# Patient Record
Sex: Female | Born: 1990 | Race: Black or African American | Hispanic: No | Marital: Single | Smoking: Former smoker
Health system: Southern US, Community
[De-identification: ages and names within clinical notes are randomized; demographics above are authoritative.]

## PROBLEM LIST (undated history)

## (undated) ENCOUNTER — Inpatient Hospital Stay (HOSPITAL_COMMUNITY): Payer: Self-pay

## (undated) DIAGNOSIS — Z789 Other specified health status: Secondary | ICD-10-CM

## (undated) DIAGNOSIS — F419 Anxiety disorder, unspecified: Secondary | ICD-10-CM

## (undated) DIAGNOSIS — F32A Depression, unspecified: Secondary | ICD-10-CM

---

## 2019-02-11 ENCOUNTER — Other Ambulatory Visit: Payer: Self-pay

## 2019-02-11 ENCOUNTER — Encounter (HOSPITAL_COMMUNITY): Payer: Self-pay

## 2019-02-11 ENCOUNTER — Emergency Department (HOSPITAL_COMMUNITY)
Admission: EM | Admit: 2019-02-11 | Discharge: 2019-02-12 | Disposition: A | Discharge status: Home / Self Care | Payer: Medicaid Other | Attending: Emergency Medicine | Admitting: Emergency Medicine

## 2019-02-11 DIAGNOSIS — R51 Headache: Secondary | ICD-10-CM | POA: Insufficient documentation

## 2019-02-11 DIAGNOSIS — B349 Viral infection, unspecified: Secondary | ICD-10-CM

## 2019-02-11 DIAGNOSIS — F17210 Nicotine dependence, cigarettes, uncomplicated: Secondary | ICD-10-CM | POA: Insufficient documentation

## 2019-02-11 DIAGNOSIS — R519 Headache, unspecified: Secondary | ICD-10-CM

## 2019-02-11 DIAGNOSIS — M791 Myalgia, unspecified site: Secondary | ICD-10-CM | POA: Diagnosis not present

## 2019-02-11 DIAGNOSIS — R509 Fever, unspecified: Secondary | ICD-10-CM | POA: Diagnosis present

## 2019-02-11 LAB — CBC WITH DIFFERENTIAL/PLATELET
Abs Immature Granulocytes: 0.02 10*3/uL (ref 0.00–0.07)
Basophils Absolute: 0 10*3/uL (ref 0.0–0.1)
Basophils Relative: 1 %
Eosinophils Absolute: 0 10*3/uL (ref 0.0–0.5)
Eosinophils Relative: 0 %
HCT: 40.7 % (ref 36.0–46.0)
Hemoglobin: 13.1 g/dL (ref 12.0–15.0)
Immature Granulocytes: 0 %
Lymphocytes Relative: 23 %
Lymphs Abs: 1.3 10*3/uL (ref 0.7–4.0)
MCH: 29.8 pg (ref 26.0–34.0)
MCHC: 32.2 g/dL (ref 30.0–36.0)
MCV: 92.7 fL (ref 80.0–100.0)
Monocytes Absolute: 1.2 10*3/uL — ABNORMAL HIGH (ref 0.1–1.0)
Monocytes Relative: 21 %
Neutro Abs: 3 10*3/uL (ref 1.7–7.7)
Neutrophils Relative %: 55 %
Platelets: 170 10*3/uL (ref 150–400)
RBC: 4.39 MIL/uL (ref 3.87–5.11)
RDW: 13.1 % (ref 11.5–15.5)
WBC: 5.5 10*3/uL (ref 4.0–10.5)
nRBC: 0 % (ref 0.0–0.2)

## 2019-02-11 LAB — LACTIC ACID, PLASMA: Lactic Acid, Venous: 1.3 mmol/L (ref 0.5–1.9)

## 2019-02-11 LAB — I-STAT BETA HCG BLOOD, ED (MC, WL, AP ONLY): I-stat hCG, quantitative: <5 m[IU]/mL (ref <5)

## 2019-02-11 MED ORDER — METHOCARBAMOL 500 MG PO TABS
1000.0000 mg | ORAL_TABLET | Freq: Once | ORAL | Status: AC
  Administered 2019-02-11: 23:00:00 1000 mg via ORAL
  Filled 2019-02-11: qty 2

## 2019-02-11 MED ORDER — METOCLOPRAMIDE HCL 5 MG/ML IJ SOLN
10.0000 mg | Freq: Once | INTRAMUSCULAR | Status: DC
  Filled 2019-02-11: qty 2

## 2019-02-11 MED ORDER — SODIUM CHLORIDE 0.9 % IV BOLUS
1000.0000 mL | Freq: Once | INTRAVENOUS | Status: AC
  Administered 2019-02-11: 1000 mL via INTRAVENOUS

## 2019-02-11 MED ORDER — KETOROLAC TROMETHAMINE 30 MG/ML IJ SOLN
30.0000 mg | Freq: Once | INTRAMUSCULAR | Status: AC
  Administered 2019-02-11: 30 mg via INTRAVENOUS
  Filled 2019-02-11: qty 1

## 2019-02-11 NOTE — ED Provider Notes (Signed)
[facility]-EMERGENCY DEPT Provider Note   CSN: [id number] Arrival date & time: 02/11/19  1833    History   Chief Complaint Chief Complaint  Patient presents with  . Fever  . Headache  . Generalized Body Aches    HPI Chelsey Townsend is a 28 y.o. female with no pmh presents to ER for evaluation of subjective fevers associated with chills, shivering, generalized, pounding headache, body aches including neck, back and extremity pain, intermittently productive cough, sharp central fleeting chest pain that is positional, light headedness and nausea.  She took theraflu without relief. No modifying factors. She has h/o occasional headaches. She lives with her parents, children and no one is sick at home. She is only going to grocery store otherwise isolating herself. No sick contacts. No travel. No exposure to suspected or confirmed COVID.   No associated rhinorrhea, congestion, sore throat, nausea, vomiting, abdominal pain, diarrhea, dysuria, hematuria.   No head trauma, blood thinners. Pain in the neck with movement but no stiffness. No rash. No photophobia, vision changes.      HPI  History reviewed. No pertinent past medical history.  There are no active problems to display for this patient.   History reviewed. No pertinent surgical history.   OB History   No obstetric history on file.      Home Medications    Prior to Admission medications   Medication Sig Start Date End Date Taking? Authorizing Provider  cyclobenzaprine (FLEXERIL) 10 MG tablet Take 1 tablet (10 mg total) by mouth 3 (three) times daily for 5 days. 02/12/19 02/17/19  Liberty HandyGibbons,  J, PA-C    Family History Family History  Problem Relation Age of Onset  . Hypertension Mother   . Diabetes Mother   . Hypertension Father   . Diabetes Father     Social History Social History   Tobacco Use  . Smoking status: Current Some Day Smoker    Types: Cigarettes  . Smokeless tobacco:  Never Used  Substance Use Topics  . Alcohol use: Never    Frequency: Never  . Drug use: Never     Allergies   Patient has no known allergies.   Review of Systems Review of Systems  Constitutional: Positive for chills and fever.  Respiratory: Positive for cough.   Cardiovascular: Positive for chest pain.  Gastrointestinal: Positive for nausea.  Musculoskeletal: Positive for myalgias and neck pain.  Neurological: Positive for light-headedness and headaches.  All other systems reviewed and are negative.    Physical Exam Updated Vital Signs BP 93/72   Pulse 86   Temp 100.3 F (37.9 C) (Oral)   Resp 14   Ht 5\' 2"  (1.575 m)   Wt 59 kg   LMP 01/11/2019   SpO2 100%   BMI 23.78 kg/m   Physical Exam Vitals signs and nursing note reviewed.  Constitutional:      Appearance: She is well-developed.     Comments: Non toxic in NAD.  Feels warm, temperature rechecked 100.3 orally.  HENT:     Head: Normocephalic and atraumatic.     Nose: Nose normal.  Eyes:     Conjunctiva/sclera: Conjunctivae normal.  Neck:     Musculoskeletal: Normal range of motion. Muscular tenderness present.     Comments: Muscular tenderness to the paraspinal cervical muscles, bilateral trapezius, SCM's.  No midline tenderness or step-offs.  She has full range of motion of the neck with some pain noted.  No true meningismus.  Negative Brudzinski's and Kernig's.  Cardiovascular:     Rate and Rhythm: Normal rate and regular rhythm.  Pulmonary:     Effort: Pulmonary effort is normal.     Breath sounds: Normal breath sounds.  Abdominal:     General: Bowel sounds are normal.     Palpations: Abdomen is soft.     Comments: No G/R/R. No suprapubic or CVA tenderness. Negative Murphy's and McBurney's. Active BS to lower quadrants.   Musculoskeletal: Normal range of motion.     Comments: Mild thoracic and lumbar muscular tenderness.  No midline T L-spine tenderness.  Mild upper and lower extremity muscular  tenderness.  Compartments are soft.  Skin:    General: Skin is warm and dry.     Capillary Refill: Capillary refill takes less than 2 seconds.  Neurological:     Mental Status: She is alert.     Comments:  Alert and oriented to self, place, time and event.  Speech is fluent without dysarthria or dysphasia. Strength 5/5 in upper and lower extremities..   Sensation to light touch intact in face, upper and lower extremities. Normal gai No pronator drift. No leg drop. Normal finger-to-nose.  CN I not tested CN II grossly intact visual fields bilaterally. Unable to visualize posterior eye. CN III, IV, VI PEERL and EOMs intact bilaterally CN V light touch intact in all 3 divisions of trigeminal nerve CN VII facial movements symmetric CN VIII not tested CN IX, X no uvula deviation, symmetric rise of soft palate  CN XI 5/5 SCM and trapezius strength bilaterally  CN XII Midline tongue protrusion, symmetric L/R movements  Psychiatric:        Behavior: Behavior normal.      ED Treatments / Results  Labs (all labs ordered are listed, but only abnormal results are displayed) Labs Reviewed  CBC WITH DIFFERENTIAL/PLATELET - Abnormal; Notable for the following components:      Result Value   Monocytes Absolute 1.2 (*)    All other components within normal limits  BASIC METABOLIC PANEL - Abnormal; Notable for the following components:   CO2 21 (*)    All other components within normal limits  LACTIC ACID, PLASMA  LACTIC ACID, PLASMA  I-STAT BETA HCG BLOOD, ED ([facility], [facility], [facility] ONLY)    EKG None  Radiology No results found.  Procedures Procedures (including critical care time)  Medications Ordered in ED Medications  sodium chloride 0.9 % bolus 1,000 mL (0 mLs Intravenous Stopped 02/12/19 0037)  ketorolac (TORADOL) 30 MG/ML injection 30 mg (30 mg Intravenous Given 02/11/19 2243)  methocarbamol (ROBAXIN) tablet 1,000 mg (1,000 mg Oral Given 02/11/19 2242)  metoCLOPramide (REGLAN)  injection 10 mg (10 mg Intravenous Given 02/12/19 0036)  diphenhydrAMINE (BENADRYL) injection 12.5 mg (12.5 mg Intravenous Given 02/12/19 0033)  acetaminophen (TYLENOL) tablet 1,000 mg (1,000 mg Oral Given 02/12/19 0032)     Initial Impression / Assessment and Plan / ED Course  I have reviewed the triage vital signs and the nursing notes.  Pertinent labs & imaging results that were available during my care of the patient were reviewed by me and considered in my medical decision making (see chart for details).     DDX includes viral illness.  She has a low-grade fever as well as others infectious symptoms, diffuse myalgias, chest pain that sounds atypical, cough.  On exam she has reproducible muscular neck tenderness but no frank meningismus, rigidity.  I have low suspicion for intracranial process such as bleed, dissection, stroke, meningitis.  I have  obtained lab work.  She has been given migraine cocktail including Reglan, Toradol, low-dose Benadryl, Tylenol and IV fluids.  I reevaluated patient and she feels moderate improvement in her symptoms specifically her headache has significantly improved.  She continues to have diffuse body aches.  Given improvement in symptoms, benign work-up feel patient is appropriate for discharge with symptomatic management of her symptoms.  I recommended alternating ibuprofen/Tylenol, rest, fluids, will give prescription for muscle relaxer to help with generalized body aches that might help.  Encouraged close follow-up with PCP in the next 48 to 72 hours for persistent symptoms.  Strict return precautions were given and patient is comfortable with this plan.  Patient shared and evaluated by EDP.  Final Clinical Impressions(s) / ED Diagnoses   Final diagnoses:  Viral illness  Myalgia  Generalized headache    ED Discharge Orders         Ordered    cyclobenzaprine (FLEXERIL) 10 MG tablet  3 times daily     02/12/19 0114           Arlean Hopping 02/12/19 0117    Palumbo, April, MD 02/12/19 4403

## 2019-02-11 NOTE — ED Triage Notes (Signed)
Patient c/o fever, headache, and body aches x 3 days that have been getting progressively worse.

## 2019-02-12 LAB — BASIC METABOLIC PANEL
Anion gap: 10 (ref 5–15)
BUN: 8 mg/dL (ref 6–20)
CO2: 21 mmol/L — ABNORMAL LOW (ref 22–32)
Calcium: 8.9 mg/dL (ref 8.9–10.3)
Chloride: 104 mmol/L (ref 98–111)
Creatinine, Ser: 0.96 mg/dL (ref 0.44–1.00)
GFR calc Af Amer: >60 mL/min (ref >60)
GFR calc non Af Amer: >60 mL/min (ref >60)
Glucose, Bld: 92 mg/dL (ref 70–99)
Potassium: 3.9 mmol/L (ref 3.5–5.1)
Sodium: 135 mmol/L (ref 135–145)

## 2019-02-12 MED ORDER — ACETAMINOPHEN 500 MG PO TABS
1000.0000 mg | ORAL_TABLET | Freq: Once | ORAL | Status: AC
  Administered 2019-02-12: 1000 mg via ORAL
  Filled 2019-02-12: qty 2

## 2019-02-12 MED ORDER — METOCLOPRAMIDE HCL 5 MG/ML IJ SOLN
10.0000 mg | Freq: Once | INTRAMUSCULAR | Status: AC
  Administered 2019-02-12: 01:00:00 10 mg via INTRAVENOUS
  Filled 2019-02-12: qty 2

## 2019-02-12 MED ORDER — DIPHENHYDRAMINE HCL 50 MG/ML IJ SOLN
12.5000 mg | Freq: Once | INTRAMUSCULAR | Status: AC
  Administered 2019-02-12: 12.5 mg via INTRAVENOUS
  Filled 2019-02-12: qty 1

## 2019-02-12 MED ORDER — CYCLOBENZAPRINE HCL 10 MG PO TABS: 10.0000 mg | ORAL_TABLET | Freq: Three times a day (TID) | ORAL | 0 refills | Status: AC

## 2019-02-12 NOTE — Discharge Instructions (Addendum)
You were seen in the ER for fevers, chills, shivering, generalized headache, body aches, dry cough, chest pain, lightheadedness, nausea.  Lab work today is reassuring.  Her symptoms improved slightly with migraine medicines, IV fluids.  I suspect you have a viral illness causing different constellation of symptoms.  Alternate ibuprofen and acetaminophen every 6-8 hours.  Stay hydrated and drink lots of water.  Rest.  Given you prescription for muscle relaxers to help with associated muscle spasms and tightness.  Follow-up with your primary care doctor in 48 to 72 hours for persistent symptoms.

## 2019-02-21 ENCOUNTER — Other Ambulatory Visit: Payer: Self-pay

## 2019-02-21 ENCOUNTER — Emergency Department (HOSPITAL_COMMUNITY)
Admission: EM | Admit: 2019-02-21 | Discharge: 2019-02-22 | Disposition: A | Discharge status: Home / Self Care | Payer: Medicaid Other | Attending: Emergency Medicine | Admitting: Emergency Medicine

## 2019-02-21 DIAGNOSIS — R197 Diarrhea, unspecified: Secondary | ICD-10-CM

## 2019-02-21 DIAGNOSIS — R112 Nausea with vomiting, unspecified: Secondary | ICD-10-CM | POA: Diagnosis present

## 2019-02-21 DIAGNOSIS — F17210 Nicotine dependence, cigarettes, uncomplicated: Secondary | ICD-10-CM | POA: Diagnosis not present

## 2019-02-21 DIAGNOSIS — U071 COVID-19: Secondary | ICD-10-CM | POA: Diagnosis not present

## 2019-02-21 DIAGNOSIS — R52 Pain, unspecified: Secondary | ICD-10-CM

## 2019-02-21 DIAGNOSIS — R1030 Lower abdominal pain, unspecified: Secondary | ICD-10-CM

## 2019-02-21 DIAGNOSIS — M79605 Pain in left leg: Secondary | ICD-10-CM | POA: Insufficient documentation

## 2019-02-21 DIAGNOSIS — M549 Dorsalgia, unspecified: Secondary | ICD-10-CM | POA: Insufficient documentation

## 2019-02-21 DIAGNOSIS — R102 Pelvic and perineal pain: Secondary | ICD-10-CM | POA: Diagnosis not present

## 2019-02-21 NOTE — ED Triage Notes (Signed)
Pt reports abdominal pain, pelvic pain, leg pain, and nausea. Pt reports she just found out that her sister, whom she lives with, is positive for COVID.  Pt reports she had 2 episodes of emesis and felt dizzy and her mom gave her medication to settle her stomach (Tums and Aleve)

## 2019-02-22 ENCOUNTER — Emergency Department (HOSPITAL_COMMUNITY): Payer: Medicaid Other

## 2019-02-22 LAB — CBC WITH DIFFERENTIAL/PLATELET
Abs Immature Granulocytes: 0.1 10*3/uL — ABNORMAL HIGH (ref 0.00–0.07)
Basophils Absolute: 0 10*3/uL (ref 0.0–0.1)
Basophils Relative: 0 %
Eosinophils Absolute: 0 10*3/uL (ref 0.0–0.5)
Eosinophils Relative: 0 %
HCT: 40.9 % (ref 36.0–46.0)
Hemoglobin: 13 g/dL (ref 12.0–15.0)
Immature Granulocytes: 1 %
Lymphocytes Relative: 21 %
Lymphs Abs: 2.6 10*3/uL (ref 0.7–4.0)
MCH: 29.8 pg (ref 26.0–34.0)
MCHC: 31.8 g/dL (ref 30.0–36.0)
MCV: 93.8 fL (ref 80.0–100.0)
Monocytes Absolute: 0.9 10*3/uL (ref 0.1–1.0)
Monocytes Relative: 7 %
Neutro Abs: 8.6 10*3/uL — ABNORMAL HIGH (ref 1.7–7.7)
Neutrophils Relative %: 71 %
Platelets: 237 10*3/uL (ref 150–400)
RBC: 4.36 MIL/uL (ref 3.87–5.11)
RDW: 13 % (ref 11.5–15.5)
WBC: 12.3 10*3/uL — ABNORMAL HIGH (ref 4.0–10.5)
nRBC: 0 % (ref 0.0–0.2)

## 2019-02-22 LAB — URINALYSIS, ROUTINE W REFLEX MICROSCOPIC
Bacteria, UA: NONE SEEN
Bilirubin Urine: NEGATIVE
Glucose, UA: NEGATIVE mg/dL
Hgb urine dipstick: NEGATIVE
Ketones, ur: NEGATIVE mg/dL
Nitrite: NEGATIVE
Protein, ur: NEGATIVE mg/dL
Specific Gravity, Urine: 1.029 (ref 1.005–1.030)
pH: 5 (ref 5.0–8.0)

## 2019-02-22 LAB — WET PREP, GENITAL
Sperm: NONE SEEN
Trich, Wet Prep: NONE SEEN
Yeast Wet Prep HPF POC: NONE SEEN

## 2019-02-22 LAB — COMPREHENSIVE METABOLIC PANEL
ALT: 17 U/L (ref 0–44)
AST: 23 U/L (ref 15–41)
Albumin: 4.1 g/dL (ref 3.5–5.0)
Alkaline Phosphatase: 61 U/L (ref 38–126)
Anion gap: 7 (ref 5–15)
BUN: 9 mg/dL (ref 6–20)
CO2: 24 mmol/L (ref 22–32)
Calcium: 9.2 mg/dL (ref 8.9–10.3)
Chloride: 108 mmol/L (ref 98–111)
Creatinine, Ser: 0.75 mg/dL (ref 0.44–1.00)
GFR calc Af Amer: >60 mL/min (ref >60)
GFR calc non Af Amer: >60 mL/min (ref >60)
Glucose, Bld: 103 mg/dL — ABNORMAL HIGH (ref 70–99)
Potassium: 4 mmol/L (ref 3.5–5.1)
Sodium: 139 mmol/L (ref 135–145)
Total Bilirubin: 0.6 mg/dL (ref 0.3–1.2)
Total Protein: 7.9 g/dL (ref 6.5–8.1)

## 2019-02-22 LAB — RPR: RPR Ser Ql: NONREACTIVE

## 2019-02-22 LAB — HIV ANTIBODY (ROUTINE TESTING W REFLEX): HIV Screen 4th Generation wRfx: NONREACTIVE

## 2019-02-22 LAB — LIPASE, BLOOD: Lipase: 34 U/L (ref 11–51)

## 2019-02-22 LAB — I-STAT BETA HCG BLOOD, ED (MC, WL, AP ONLY): I-stat hCG, quantitative: <5 m[IU]/mL (ref <5)

## 2019-02-22 MED ORDER — ONDANSETRON HCL 4 MG/2ML IJ SOLN
4.0000 mg | Freq: Once | INTRAMUSCULAR | Status: AC
  Administered 2019-02-22: 4 mg via INTRAVENOUS
  Filled 2019-02-22: qty 2

## 2019-02-22 MED ORDER — METHOCARBAMOL 500 MG PO TABS: 500.0000 mg | ORAL_TABLET | Freq: Two times a day (BID) | ORAL | 0 refills | Status: DC

## 2019-02-22 MED ORDER — KETOROLAC TROMETHAMINE 30 MG/ML IJ SOLN
30.0000 mg | Freq: Once | INTRAMUSCULAR | Status: AC
  Administered 2019-02-22: 30 mg via INTRAVENOUS
  Filled 2019-02-22: qty 1

## 2019-02-22 MED ORDER — SODIUM CHLORIDE 0.9 % IV BOLUS
1000.0000 mL | Freq: Once | INTRAVENOUS | Status: AC
  Administered 2019-02-22: 04:00:00 1000 mL via INTRAVENOUS

## 2019-02-22 MED ORDER — METRONIDAZOLE 500 MG PO TABS: 500.0000 mg | ORAL_TABLET | Freq: Two times a day (BID) | ORAL | 0 refills | Status: DC

## 2019-02-22 NOTE — ED Notes (Signed)
Bed: WA03 Expected date:  Expected time:  Means of arrival:  Comments: 

## 2019-02-22 NOTE — Discharge Instructions (Addendum)
Medications were sent to your pharmacy.  They should be ready later today. Try to take the flagyl with some food and avoid drinking alcohol while taking this. STD tests should come back in the next 48-72 hours, you can also follow-up on these on mychart.  You will be notified of any abnormal results. Can follow-up with the [facility] clinic for any ongoing issues-- call for appt. Return here for any new/acute changes.

## 2019-02-22 NOTE — ED Notes (Signed)
Asked for urine again  

## 2019-02-22 NOTE — ED Provider Notes (Signed)
Assumed care from PA Venter at shift change.  See prior note for full H&P.  Briefly, 28 year old female here with multiple complaints.  She reports pelvic pain, nausea, vomiting, diarrhea, back pain, left leg pain that all began today.  She also reports her sister recently tested positive for COVID and she feels she needs to be tested.  Plan: Work-up was delayed due to limited room availability.  Labs and pelvic ultrasound are pending.  Results for orders placed or performed during the hospital encounter of 02/21/19  Wet prep, genital   Specimen: Cervical/Vaginal swab  Result Value Ref Range   Yeast Wet Prep HPF POC NONE SEEN NONE SEEN   Trich, Wet Prep NONE SEEN NONE SEEN   Clue Cells Wet Prep HPF POC PRESENT (A) NONE SEEN   WBC, Wet Prep HPF POC MANY (A) NONE SEEN   Sperm NONE SEEN   Comprehensive metabolic panel  Result Value Ref Range   Sodium 139 135 - 145 mmol/L   Potassium 4.0 3.5 - 5.1 mmol/L   Chloride 108 98 - 111 mmol/L   CO2 24 22 - 32 mmol/L   Glucose, Bld 103 (H) 70 - 99 mg/dL   BUN 9 6 - 20 mg/dL   Creatinine, Ser 4.090.75 0.44 - 1.00 mg/dL   Calcium 9.2 8.9 - 81.110.3 mg/dL   Total Protein 7.9 6.5 - 8.1 g/dL   Albumin 4.1 3.5 - 5.0 g/dL   AST 23 15 - 41 U/L   ALT 17 0 - 44 U/L   Alkaline Phosphatase 61 38 - 126 U/L   Total Bilirubin 0.6 0.3 - 1.2 mg/dL   GFR calc non Af Amer >60 >60 mL/min   GFR calc Af Amer >60 >60 mL/min   Anion gap 7 5 - 15  Lipase, blood  Result Value Ref Range   Lipase 34 11 - 51 U/L  CBC with Diff  Result Value Ref Range   WBC 12.3 (H) 4.0 - 10.5 K/uL   RBC 4.36 3.87 - 5.11 MIL/uL   Hemoglobin 13.0 12.0 - 15.0 g/dL   HCT 91.440.9 78.236.0 - 95.646.0 %   MCV 93.8 80.0 - 100.0 fL   MCH 29.8 26.0 - 34.0 pg   MCHC 31.8 30.0 - 36.0 g/dL   RDW 21.313.0 08.611.5 - 57.815.5 %   Platelets 237 150 - 400 K/uL   nRBC 0.0 0.0 - 0.2 %   Neutrophils Relative % 71 %   Neutro Abs 8.6 (H) 1.7 - 7.7 K/uL   Lymphocytes Relative 21 %   Lymphs Abs 2.6 0.7 - 4.0 K/uL   Monocytes  Relative 7 %   Monocytes Absolute 0.9 0.1 - 1.0 K/uL   Eosinophils Relative 0 %   Eosinophils Absolute 0.0 0.0 - 0.5 K/uL   Basophils Relative 0 %   Basophils Absolute 0.0 0.0 - 0.1 K/uL   Immature Granulocytes 1 %   Abs Immature Granulocytes 0.10 (H) 0.00 - 0.07 K/uL  I-Stat beta hCG blood, ED  Result Value Ref Range   I-stat hCG, quantitative <5.0 <5 mIU/mL   Comment 3           [place] Pelvis Complete  Result Date: 02/22/2019 CLINICAL DATA:  Initial evaluation for acute left lower quadrant abdominal pain/pelvic pain. EXAM: TRANSABDOMINAL AND TRANSVAGINAL ULTRASOUND OF PELVIS DOPPLER ULTRASOUND OF OVARIES TECHNIQUE: Both transabdominal and transvaginal ultrasound examinations of the pelvis were performed. Transabdominal technique was performed for global imaging of the pelvis including uterus, ovaries, adnexal regions, and pelvic  cul-de-sac. It was necessary to proceed with endovaginal exam following the transabdominal exam to visualize the uterus, endometrium, and ovaries. Color and duplex Doppler ultrasound was utilized to evaluate blood flow to the ovaries. COMPARISON:  None. FINDINGS: Uterus Measurements: 8.2 x 4.7 x 5.8 cm = volume: 117 mL. No fibroids or other mass visualized. Endometrium Thickness: 9 mm.  No focal abnormality visualized. Right ovary Measurements: 4.7 x 3.6 x 3.7 cm = volume: 32.7 mL. Normal appearance/no adnexal mass. Left ovary Measurements: 3.3 x 2.6 x 2.3 cm = volume: 10.3 mL. Normal appearance/no adnexal mass. Pulsed Doppler evaluation of both ovaries demonstrates normal low-resistance arterial and venous waveforms. Other findings Trace free physiologic fluid present within the pelvis, presumably physiologic. IMPRESSION: Normal pelvic ultrasound for age. No evidence for torsion or other acute abnormality. Electronically Signed   By: Jeannine Boga M.D.   On: 02/22/2019 01:58   [place] Art/ven Flow Abd Pelv Doppler  Result Date: 02/22/2019 CLINICAL DATA:  Initial evaluation  for acute left lower quadrant abdominal pain/pelvic pain. EXAM: TRANSABDOMINAL AND TRANSVAGINAL ULTRASOUND OF PELVIS DOPPLER ULTRASOUND OF OVARIES TECHNIQUE: Both transabdominal and transvaginal ultrasound examinations of the pelvis were performed. Transabdominal technique was performed for global imaging of the pelvis including uterus, ovaries, adnexal regions, and pelvic cul-de-sac. It was necessary to proceed with endovaginal exam following the transabdominal exam to visualize the uterus, endometrium, and ovaries. Color and duplex Doppler ultrasound was utilized to evaluate blood flow to the ovaries. COMPARISON:  None. FINDINGS: Uterus Measurements: 8.2 x 4.7 x 5.8 cm = volume: 117 mL. No fibroids or other mass visualized. Endometrium Thickness: 9 mm.  No focal abnormality visualized. Right ovary Measurements: 4.7 x 3.6 x 3.7 cm = volume: 32.7 mL. Normal appearance/no adnexal mass. Left ovary Measurements: 3.3 x 2.6 x 2.3 cm = volume: 10.3 mL. Normal appearance/no adnexal mass. Pulsed Doppler evaluation of both ovaries demonstrates normal low-resistance arterial and venous waveforms. Other findings Trace free physiologic fluid present within the pelvis, presumably physiologic. IMPRESSION: Normal pelvic ultrasound for age. No evidence for torsion or other acute abnormality. Electronically Signed   By: Jeannine Boga M.D.   On: 02/22/2019 01:58   US Pelvic Complete W Transvaginal And Torsion R/o  Result Date: 02/22/2019 CLINICAL DATA:  Initial evaluation for acute left lower quadrant abdominal pain/pelvic pain. EXAM: TRANSABDOMINAL AND TRANSVAGINAL ULTRASOUND OF PELVIS DOPPLER ULTRASOUND OF OVARIES TECHNIQUE: Both transabdominal and transvaginal ultrasound examinations of the pelvis were performed. Transabdominal technique was performed for global imaging of the pelvis including uterus, ovaries, adnexal regions, and pelvic cul-de-sac. It was necessary to proceed with endovaginal exam following the  transabdominal exam to visualize the uterus, endometrium, and ovaries. Color and duplex Doppler ultrasound was utilized to evaluate blood flow to the ovaries. COMPARISON:  None. FINDINGS: Uterus Measurements: 8.2 x 4.7 x 5.8 cm = volume: 117 mL. No fibroids or other mass visualized. Endometrium Thickness: 9 mm.  No focal abnormality visualized. Right ovary Measurements: 4.7 x 3.6 x 3.7 cm = volume: 32.7 mL. Normal appearance/no adnexal mass. Left ovary Measurements: 3.3 x 2.6 x 2.3 cm = volume: 10.3 mL. Normal appearance/no adnexal mass. Pulsed Doppler evaluation of both ovaries demonstrates normal low-resistance arterial and venous waveforms. Other findings Trace free physiologic fluid present within the pelvis, presumably physiologic. IMPRESSION: Normal pelvic ultrasound for age. No evidence for torsion or other acute abnormality. Electronically Signed   By: Jeannine Boga M.D.   On: 02/22/2019 01:58   Patient's labs are overall reassuring.  Wet prep  does show clue cells.  Pelvic ultrasound is negative.   Patient reassessed and states she is uncomfortable.  States it feels a lot of cramping down in her pelvis.  She has had some urinary frequency but no dysuria or noted hematuria.  She was given dose of Toradol.  Awaiting UA.  UA without signs of infection.  Patient is feeling better after Toradol.  She has not had any active emesis or diarrhea here.  Her nausea, vomiting, and diarrhea may represent viral process.  Her COVID screen is pending.  She will be treated with course of Flagyl for BV.  Remainder of STD test are pending, advise she will be notified if any abnormal results in the next 48 hours or so.  Follow-up with PCP.  Return here for any new or acute changes.   Garlon HatchetSanders, Lisa M, PA-C 02/22/19 0629    Ward, Layla MawKristen N, DO 02/22/19 [phone]

## 2019-02-22 NOTE — ED Provider Notes (Signed)
[facility]-EMERGENCY DEPT Provider Note   CSN: [id number] Arrival date & time: 02/21/19  2231    History   Chief Complaint Chief Complaint  Patient presents with  . COVID exposure  . Abdominal Pain  . Leg Pain  . Emesis    HPI Chelsey Townsend is a 28 y.o. female who presents to the ED with multiple complaints including gradual onset, constant, cramping, suprapubic abdominal pain that began approximately 4 hours ago. Pt also complaining of pelvic pain, nausea, nonbloody nonbilious emesis, diarrhea, back pain, and left leg pain that all began at the same time. Pt states her leg pain starts in her hip and runs the entire length of her leg. No known injury. She states she recently found out that her sister has tested positive for covid 19 and she would like to be tested as well. No fever, chills, cough, or shortness of breath. Pt denies weakness, numbness, paresthesias in leg, urinary retention, urinary or bowel incontinence, saddle anesthesia.       No past medical history on file.  There are no active problems to display for this patient.   No past surgical history on file.   OB History   No obstetric history on file.      Home Medications    Prior to Admission medications   Not on File    Family History Family History  Problem Relation Age of Onset  . Hypertension Mother   . Diabetes Mother   . Hypertension Father   . Diabetes Father     Social History Social History   Tobacco Use  . Smoking status: Current Some Day Smoker    Types: Cigarettes  . Smokeless tobacco: Never Used  Substance Use Topics  . Alcohol use: Never    Frequency: Never  . Drug use: Never     Allergies   Patient has no known allergies.   Review of Systems Review of Systems  Constitutional: Negative for chills and fever.  HENT: Negative for congestion.   Eyes: Negative for visual disturbance.  Respiratory: Negative for cough and shortness of breath.    Cardiovascular: Negative for chest pain and leg swelling.  Gastrointestinal: Positive for abdominal pain, diarrhea, nausea and vomiting. Negative for blood in stool and constipation.  Genitourinary: Positive for pelvic pain. Negative for difficulty urinating, flank pain, frequency, hematuria, vaginal bleeding and vaginal discharge.  Musculoskeletal: Positive for back pain. Negative for gait problem and neck pain.  Skin: Negative for rash.  Neurological: Negative for headaches.     Physical Exam Updated Vital Signs BP 95/70   Pulse 74   Temp 98.9 F (37.2 C) (Oral)   Resp 18   LMP 02/19/2019   SpO2 100%   Physical Exam Vitals signs and nursing note reviewed.  Constitutional:      Appearance: She is not ill-appearing.  HENT:     Head: Normocephalic and atraumatic.  Eyes:     Conjunctiva/sclera: Conjunctivae normal.  Neck:     Musculoskeletal: Neck supple.  Cardiovascular:     Rate and Rhythm: Normal rate and regular rhythm.  Pulmonary:     Effort: Pulmonary effort is normal.     Breath sounds: Normal breath sounds. No wheezing, rhonchi or rales.  Abdominal:     Palpations: Abdomen is soft.     Tenderness: There is abdominal tenderness in the suprapubic area. There is no right CVA tenderness, left CVA tenderness, guarding or rebound. Negative signs include McBurney's sign.  Musculoskeletal:  Comments: No C, T, or L midline spinal tenderness. No stepoffs or deformities. No paraspinal tenderness. Strength and sensation intact throughout. Distal pulses intact.   MAE x4. No obvious tenderness to left hip, leg knee, left ankle, left foot.  Strength and sensation grossly intact in all extremities Gait steady    Skin:    General: Skin is warm and dry.  Neurological:     Mental Status: She is alert.      ED Treatments / Results  Labs (all labs ordered are listed, but only abnormal results are displayed) Labs Reviewed  WET PREP, GENITAL  NOVEL CORONAVIRUS, NAA  (HOSPITAL ORDER, SEND-OUT TO REF LAB)  COMPREHENSIVE METABOLIC PANEL  LIPASE, BLOOD  CBC WITH DIFFERENTIAL/PLATELET  URINALYSIS, ROUTINE W REFLEX MICROSCOPIC  RPR  HIV ANTIBODY (ROUTINE TESTING W REFLEX)  I-STAT BETA HCG BLOOD, ED ([facility], [facility], [facility] ONLY)  GC/CHLAMYDIA PROBE AMP ([place]) NOT AT [facility]    EKG None  Radiology No results found.  Procedures Procedures (including critical care time)  Medications Ordered in ED Medications  sodium chloride 0.9 % bolus 1,000 mL (has no administration in time range)  ondansetron (ZOFRAN) injection 4 mg (has no administration in time range)     Initial Impression / Assessment and Plan / ED Course  I have reviewed the triage vital signs and the nursing notes.  Pertinent labs & imaging results that were available during my care of the patient were reviewed by me and considered in my medical decision making (see chart for details).    Pt is a 28 year old female presenting to the ED with multiple complaints including abdominal pain, N/V/D, pelvic pain, back pain, and left leg pain. No injury to the leg. Pt reports all of her symptoms started simultaneously; recently found out her sister tested positive for covid 36; pt lives with sister and would like to be tested. Given complaint of pelvic pain will order pelvic ultrasound to rule out torsion vs TOA. Pt without complaints of vaginal discharge, bleeding, itching. Will have her self swab at this time for wet prep and GC/chlamydia. Will give 1 L NS bolus as well as zofran for comfort. Baseline bloodwork also obtained as well as send out covid test. Pt has no midline spinal tenderness to back on exam; no red flags concerning for cauda equina. Concerning left leg she is without tenderness as well; no diffuse swelling to suggest DVT. Strength and sensation intact throughout.   12:53 AM At shift change case signed out to Quincy Carnes, PA-C, who will await lab results and pelvic ultrasound and disposition  patient accordingly. If pelvic ultrasound negative, may consider CT A/P to rule out appendicitis.        Final Clinical Impressions(s) / ED Diagnoses   Final diagnoses:  Nausea vomiting and diarrhea  Lower abdominal pain  Pelvic pain    ED Discharge Orders    None       Eustaquio Maize, PA-C 02/22/19 0054    Ward, Delice Bison, DO 02/22/19 [phone]

## 2019-02-23 LAB — GC/CHLAMYDIA PROBE AMP (~~LOC~~) NOT AT ARMC
Chlamydia: NEGATIVE
Neisseria Gonorrhea: NEGATIVE

## 2019-02-25 LAB — NOVEL CORONAVIRUS, NAA (HOSP ORDER, SEND-OUT TO REF LAB; TAT 18-24 HRS): SARS-CoV-2, NAA: DETECTED — AB

## 2020-01-26 ENCOUNTER — Encounter (HOSPITAL_COMMUNITY): Payer: Self-pay | Admitting: Emergency Medicine

## 2020-01-26 ENCOUNTER — Emergency Department (HOSPITAL_COMMUNITY)
Admission: EM | Admit: 2020-01-26 | Discharge: 2020-01-27 | Disposition: A | Discharge status: Home / Self Care | Payer: Medicaid Other | Attending: Emergency Medicine | Admitting: Emergency Medicine

## 2020-01-26 ENCOUNTER — Other Ambulatory Visit: Payer: Self-pay

## 2020-01-26 DIAGNOSIS — R1030 Lower abdominal pain, unspecified: Secondary | ICD-10-CM | POA: Diagnosis not present

## 2020-01-26 DIAGNOSIS — R102 Pelvic and perineal pain: Secondary | ICD-10-CM

## 2020-01-26 DIAGNOSIS — O99332 Smoking (tobacco) complicating pregnancy, second trimester: Secondary | ICD-10-CM | POA: Insufficient documentation

## 2020-01-26 DIAGNOSIS — O26892 Other specified pregnancy related conditions, second trimester: Secondary | ICD-10-CM | POA: Diagnosis present

## 2020-01-26 DIAGNOSIS — Z3A18 18 weeks gestation of pregnancy: Secondary | ICD-10-CM | POA: Diagnosis not present

## 2020-01-26 DIAGNOSIS — F17210 Nicotine dependence, cigarettes, uncomplicated: Secondary | ICD-10-CM | POA: Insufficient documentation

## 2020-01-26 NOTE — ED Triage Notes (Signed)
Patient c/o abdominal cramping and pressure today. Reports she is [redacted] weeks pregnant. Reports small amount of vaginal bleeding x2 days ago.

## 2020-01-27 LAB — CBC
HCT: 35.5 % — ABNORMAL LOW (ref 36.0–46.0)
Hemoglobin: 11.4 g/dL — ABNORMAL LOW (ref 12.0–15.0)
MCH: 30.4 pg (ref 26.0–34.0)
MCHC: 32.1 g/dL (ref 30.0–36.0)
MCV: 94.7 fL (ref 80.0–100.0)
Platelets: 194 10*3/uL (ref 150–400)
RBC: 3.75 MIL/uL — ABNORMAL LOW (ref 3.87–5.11)
RDW: 13.2 % (ref 11.5–15.5)
WBC: 16.8 10*3/uL — ABNORMAL HIGH (ref 4.0–10.5)
nRBC: 0 % (ref 0.0–0.2)

## 2020-01-27 LAB — COMPREHENSIVE METABOLIC PANEL
ALT: 21 U/L (ref 0–44)
AST: 17 U/L (ref 15–41)
Albumin: 3.3 g/dL — ABNORMAL LOW (ref 3.5–5.0)
Alkaline Phosphatase: 65 U/L (ref 38–126)
Anion gap: 8 (ref 5–15)
BUN: 7 mg/dL (ref 6–20)
CO2: 23 mmol/L (ref 22–32)
Calcium: 8.8 mg/dL — ABNORMAL LOW (ref 8.9–10.3)
Chloride: 103 mmol/L (ref 98–111)
Creatinine, Ser: 0.7 mg/dL (ref 0.44–1.00)
GFR calc Af Amer: >60 mL/min (ref >60)
GFR calc non Af Amer: >60 mL/min (ref >60)
Glucose, Bld: 99 mg/dL (ref 70–99)
Potassium: 3.8 mmol/L (ref 3.5–5.1)
Sodium: 134 mmol/L — ABNORMAL LOW (ref 135–145)
Total Bilirubin: 0.4 mg/dL (ref 0.3–1.2)
Total Protein: 7.3 g/dL (ref 6.5–8.1)

## 2020-01-27 LAB — URINALYSIS, ROUTINE W REFLEX MICROSCOPIC
Bacteria, UA: NONE SEEN
Bilirubin Urine: NEGATIVE
Glucose, UA: NEGATIVE mg/dL
Hgb urine dipstick: NEGATIVE
Ketones, ur: NEGATIVE mg/dL
Nitrite: NEGATIVE
Protein, ur: NEGATIVE mg/dL
Specific Gravity, Urine: 1.028 (ref 1.005–1.030)
pH: 6 (ref 5.0–8.0)

## 2020-01-27 MED ORDER — CEPHALEXIN 500 MG PO CAPS: 500.0000 mg | ORAL_CAPSULE | Freq: Two times a day (BID) | ORAL | 0 refills | Status: DC

## 2020-01-27 NOTE — ED Provider Notes (Signed)
[facility] DEPT Provider Note   CSN: [id number] Arrival date & time: 01/26/20  2227     History Chief Complaint  Patient presents with  . Abdominal Pain    Chelsey Townsend is a 29 y.o. female.  Patient is a 29 year old female G3 P2002 at [redacted] weeks gestation.  She presents today for evaluation of abdominal pain.  She describes a burning pain in the suprapubic region that has been present for the past several days.  She had slight spotting 2 days ago, but this has since resolved.  She discussed this with her OB who was not concerned.  She presents this evening to be sure everything is okay.  She denies any discharge.  She denies any fevers.  She denies any bowel or bladder complaints.  The history is provided by the patient.  Abdominal Pain Pain location:  Suprapubic Pain quality: burning   Pain radiates to:  Does not radiate Pain severity:  Moderate Onset quality:  Gradual Duration:  3 days Timing:  Constant Progression:  Worsening Chronicity:  New Relieved by:  Nothing Worsened by:  Nothing      History reviewed. No pertinent past medical history.  There are no problems to display for this patient.   History reviewed. No pertinent surgical history.   OB History    Gravida  1   Para      Term      Preterm      AB      Living        SAB      TAB      Ectopic      Multiple      Live Births              Family History  Problem Relation Age of Onset  . Hypertension Mother   . Diabetes Mother   . Hypertension Father   . Diabetes Father     Social History   Tobacco Use  . Smoking status: Current Some Day Smoker    Types: Cigarettes  . Smokeless tobacco: Never Used  Vaping Use  . Vaping Use: Never used  Substance Use Topics  . Alcohol use: Never  . Drug use: Never    Home Medications Prior to Admission medications   Medication Sig Start Date End Date Taking? Authorizing Provider  calcium carbonate  (TUMS EX) 750 MG chewable tablet Chew 2 tablets by mouth daily as needed for heartburn.    [provider]  ibuprofen (ADVIL) 200 MG tablet Take 400 mg by mouth every 6 (six) hours as needed for mild pain.    [provider]  methocarbamol (ROBAXIN) 500 MG tablet Take 1 tablet (500 mg total) by mouth 2 (two) times daily. 02/22/19   Larene Pickett, PA-C  metroNIDAZOLE (FLAGYL) 500 MG tablet Take 1 tablet (500 mg total) by mouth 2 (two) times daily. 02/22/19   Larene Pickett, PA-C    Allergies    Patient has no known allergies.  Review of Systems   Review of Systems  Gastrointestinal: Positive for abdominal pain.  All other systems reviewed and are negative.   Physical Exam Updated Vital Signs BP 124/78   Pulse 78   Temp 99.3 F (37.4 C) (Oral)   Resp 17   LMP 02/19/2019   SpO2 100%   Physical Exam Vitals and nursing note reviewed.  Constitutional:      General: She is not in acute distress.  Appearance: She is well-developed. She is not diaphoretic.  HENT:     Head: Normocephalic and atraumatic.  Cardiovascular:     Rate and Rhythm: Normal rate and regular rhythm.     Heart sounds: No murmur heard.  No friction rub. No gallop.   Pulmonary:     Effort: Pulmonary effort is normal. No respiratory distress.     Breath sounds: Normal breath sounds. No wheezing.  Abdominal:     General: Bowel sounds are normal. There is no distension.     Palpations: Abdomen is soft.     Tenderness: There is abdominal tenderness in the suprapubic area. There is no right CVA tenderness, left CVA tenderness, guarding or rebound.  Musculoskeletal:        General: Normal range of motion.     Cervical back: Normal range of motion and neck supple.  Skin:    General: Skin is warm and dry.  Neurological:     Mental Status: She is alert and oriented to person, place, and time.     ED Results / Procedures / Treatments   Labs (all labs ordered are listed, but only abnormal  results are displayed) Labs Reviewed  URINALYSIS, ROUTINE W REFLEX MICROSCOPIC - Abnormal; Notable for the following components:      Result Value   Leukocytes,Ua TRACE (*)    All other components within normal limits  COMPREHENSIVE METABOLIC PANEL  CBC    EKG None  Radiology No results found.  Procedures Procedures (including critical care time)  Medications Ordered in ED Medications - No data to display  ED Course  I have reviewed the triage vital signs and the nursing notes.  Pertinent labs & imaging results that were available during my care of the patient were reviewed by me and considered in my medical decision making (see chart for details).    MDM Rules/Calculators/A&P  Patient is a 29 year old female G3 P2-0-0-2 at [redacted] weeks gestation presenting with complaints of suprapubic discomfort.  This has been occurring intermittently throughout her pregnancy, however has been worse over the past 2 days.  She denies any fevers or chills.  She denies any urinary or bowel complaints.  Her vitals are stable and she is afebrile.  There is mild suprapubic tenderness, however no rebound or guarding.  Her work-up shows a white count of 16.8, suspected to be pregnancy related.  Her urinalysis does show trace leukocytes and 11-20 whites.  This will be treated with Keflex.  At this point, I feel as though the patient is appropriate for discharge.  I have considered appendicitis, however do not believe this to be the case.  Her abdomen has been reexamined and tenderness is localized to the suprapubic region.  I strongly suspect she is experiencing round ligament pain as the cause of her discomfort.  She will be advised to take Tylenol for this as needed.  I have discussed the disposition with the patient.  With shared decision making, patient will be discharged to home and is to return if she develops worsening pain, high fevers, or other issues.  If this occurs, she may need an MRI to rule  out appendicitis.  Final Clinical Impression(s) / ED Diagnoses Final diagnoses:  None    Rx / DC Orders ED Discharge Orders    None       Geoffery Lyons, MD 01/27/20 8676

## 2020-01-27 NOTE — Discharge Instructions (Signed)
Take Tylenol 1000 mg every 6 hours as needed for pain.  Begin taking Keflex as prescribed.  Return to the emergency department if you develop worsening pain, high fever, vaginal bleeding, or other new and concerning symptoms.

## 2020-02-08 ENCOUNTER — Other Ambulatory Visit: Payer: Self-pay

## 2020-02-08 ENCOUNTER — Encounter (HOSPITAL_COMMUNITY): Payer: Self-pay | Admitting: Emergency Medicine

## 2020-02-08 ENCOUNTER — Emergency Department (HOSPITAL_COMMUNITY): Payer: Medicaid Other

## 2020-02-08 ENCOUNTER — Emergency Department (HOSPITAL_COMMUNITY)
Admission: EM | Admit: 2020-02-08 | Discharge: 2020-02-08 | Disposition: A | Discharge status: Home / Self Care | Payer: Medicaid Other | Attending: Emergency Medicine | Admitting: Emergency Medicine

## 2020-02-08 DIAGNOSIS — O26612 Liver and biliary tract disorders in pregnancy, second trimester: Secondary | ICD-10-CM | POA: Insufficient documentation

## 2020-02-08 DIAGNOSIS — F17210 Nicotine dependence, cigarettes, uncomplicated: Secondary | ICD-10-CM | POA: Insufficient documentation

## 2020-02-08 DIAGNOSIS — Z79899 Other long term (current) drug therapy: Secondary | ICD-10-CM | POA: Insufficient documentation

## 2020-02-08 DIAGNOSIS — R1084 Generalized abdominal pain: Secondary | ICD-10-CM

## 2020-02-08 DIAGNOSIS — O26892 Other specified pregnancy related conditions, second trimester: Secondary | ICD-10-CM | POA: Diagnosis present

## 2020-02-08 DIAGNOSIS — R7989 Other specified abnormal findings of blood chemistry: Secondary | ICD-10-CM

## 2020-02-08 LAB — URINALYSIS, ROUTINE W REFLEX MICROSCOPIC
Bacteria, UA: NONE SEEN
Bilirubin Urine: NEGATIVE
Glucose, UA: NEGATIVE mg/dL
Hgb urine dipstick: NEGATIVE
Ketones, ur: 20 mg/dL — AB
Nitrite: NEGATIVE
Protein, ur: NEGATIVE mg/dL
Specific Gravity, Urine: 1.019 (ref 1.005–1.030)
pH: 5 (ref 5.0–8.0)

## 2020-02-08 LAB — COMPREHENSIVE METABOLIC PANEL
ALT: 50 U/L — ABNORMAL HIGH (ref 0–44)
AST: 49 U/L — ABNORMAL HIGH (ref 15–41)
Albumin: 3.2 g/dL — ABNORMAL LOW (ref 3.5–5.0)
Alkaline Phosphatase: 69 U/L (ref 38–126)
Anion gap: 5 (ref 5–15)
BUN: 6 mg/dL (ref 6–20)
CO2: 24 mmol/L (ref 22–32)
Calcium: 8.8 mg/dL — ABNORMAL LOW (ref 8.9–10.3)
Chloride: 106 mmol/L (ref 98–111)
Creatinine, Ser: 0.53 mg/dL (ref 0.44–1.00)
GFR calc Af Amer: >60 mL/min (ref >60)
GFR calc non Af Amer: >60 mL/min (ref >60)
Glucose, Bld: 84 mg/dL (ref 70–99)
Potassium: 4.2 mmol/L (ref 3.5–5.1)
Sodium: 135 mmol/L (ref 135–145)
Total Bilirubin: 0.3 mg/dL (ref 0.3–1.2)
Total Protein: 7.3 g/dL (ref 6.5–8.1)

## 2020-02-08 LAB — CBC
HCT: 37.2 % (ref 36.0–46.0)
Hemoglobin: 12 g/dL (ref 12.0–15.0)
MCH: 30.5 pg (ref 26.0–34.0)
MCHC: 32.3 g/dL (ref 30.0–36.0)
MCV: 94.4 fL (ref 80.0–100.0)
Platelets: 204 10*3/uL (ref 150–400)
RBC: 3.94 MIL/uL (ref 3.87–5.11)
RDW: 13.3 % (ref 11.5–15.5)
WBC: 19.7 10*3/uL — ABNORMAL HIGH (ref 4.0–10.5)
nRBC: 0.1 % (ref 0.0–0.2)

## 2020-02-08 LAB — LIPASE, BLOOD: Lipase: 31 U/L (ref 11–51)

## 2020-02-08 MED ORDER — SODIUM CHLORIDE 0.9% FLUSH: 3.0000 mL | Freq: Once | INTRAVENOUS | Status: DC

## 2020-02-08 NOTE — ED Provider Notes (Signed)
[facility] DEPT Provider Note   CSN: [id number] Arrival date & time: 02/08/20  1415     History Chief Complaint  Patient presents with  . Abdominal Pain  . Pregnant    Chelsey Townsend is a 29 y.o. G3, P2 female of [redacted] weeks gestation presents for evaluation of acute onset, persistent abdominal pain beginning late last night around 11 PM.  Reports pain has been persistent, generalized, sharp.  Does not radiate.  No aggravating or alleviating factors noted.  She denies fevers, nausea, vomiting, urinary symptoms, vaginal bleeding.  She does report good fetal movement.  She has tried drinking water and resting without relief of symptoms.  She has been experiencing intermittent chest pain and shortness of breath throughout the pregnancy but has not had any symptoms from this standpoint for 1 week.  She was recently treated for a UTI with Keflex and reports improvement in urinary symptoms since then.  The history is provided by the patient.       History reviewed. No pertinent past medical history.  There are no problems to display for this patient.   History reviewed. No pertinent surgical history.   OB History    Gravida  1   Para      Term      Preterm      AB      Living        SAB      TAB      Ectopic      Multiple      Live Births              Family History  Problem Relation Age of Onset  . Hypertension Mother   . Diabetes Mother   . Hypertension Father   . Diabetes Father     Social History   Tobacco Use  . Smoking status: Current Some Day Smoker    Types: Cigarettes  . Smokeless tobacco: Never Used  Vaping Use  . Vaping Use: Never used  Substance Use Topics  . Alcohol use: Never  . Drug use: Never    Home Medications Prior to Admission medications   Medication Sig Start Date End Date Taking? Authorizing Provider  acetaminophen (TYLENOL) 500 MG tablet Take 500 mg by mouth every 6 (six) hours as needed  for mild pain.   Yes [provider]  cephALEXin (KEFLEX) 500 MG capsule Take 1 capsule (500 mg total) by mouth 2 (two) times daily. 01/27/20  Yes Delo, Nathaneil Canary, MD  Prenatal Vit-Fe Fumarate-FA (PRENATAL VITAMINS) 28-0.8 MG TABS Take 1 tablet by mouth daily.   Yes [provider]  methocarbamol (ROBAXIN) 500 MG tablet Take 1 tablet (500 mg total) by mouth 2 (two) times daily. Patient not taking: Reported on 02/08/2020 02/22/19   Larene Pickett, PA-C  metroNIDAZOLE (FLAGYL) 500 MG tablet Take 1 tablet (500 mg total) by mouth 2 (two) times daily. Patient not taking: Reported on 02/08/2020 02/22/19   Larene Pickett, PA-C    Allergies    Patient has no known allergies.  Review of Systems   Review of Systems  Constitutional: Negative for chills and fever.  Respiratory: Negative for shortness of breath.   Cardiovascular: Negative for chest pain.  Gastrointestinal: Positive for abdominal pain. Negative for diarrhea, nausea and vomiting.  Genitourinary: Negative for dysuria, frequency, urgency and vaginal bleeding.  All other systems reviewed and are negative.   Physical Exam Updated Vital Signs BP 119/71   Pulse  73   Temp 98.2 F (36.8 C) (Oral)   Resp 12   LMP 02/19/2019   SpO2 100%   Physical Exam Vitals and nursing note reviewed.  Constitutional:      General: She is not in acute distress.    Appearance: She is well-developed.  HENT:     Head: Normocephalic and atraumatic.  Eyes:     General:        Right eye: No discharge.        Left eye: No discharge.     Conjunctiva/sclera: Conjunctivae normal.  Neck:     Vascular: No JVD.     Trachea: No tracheal deviation.  Cardiovascular:     Rate and Rhythm: Normal rate and regular rhythm.  Pulmonary:     Effort: Pulmonary effort is normal.     Breath sounds: Normal breath sounds.  Abdominal:     General: Bowel sounds are decreased. There is no distension.     Palpations: Abdomen is soft.     Tenderness: There  is generalized abdominal tenderness. There is no right CVA tenderness, left CVA tenderness, guarding or rebound. Negative signs include Murphy's sign.     Comments: Gravid abdomen, uterine fundus measuring at the level of the umbilicus  Skin:    General: Skin is warm and dry.     Findings: No erythema.  Neurological:     Mental Status: She is alert.  Psychiatric:        Behavior: Behavior normal.     ED Results / Procedures / Treatments   Labs (all labs ordered are listed, but only abnormal results are displayed) Labs Reviewed  COMPREHENSIVE METABOLIC PANEL - Abnormal; Notable for the following components:      Result Value   Calcium 8.8 (*)    Albumin 3.2 (*)    AST 49 (*)    ALT 50 (*)    All other components within normal limits  CBC - Abnormal; Notable for the following components:   WBC 19.7 (*)    All other components within normal limits  URINALYSIS, ROUTINE W REFLEX MICROSCOPIC - Abnormal; Notable for the following components:   Ketones, ur 20 (*)    Leukocytes,Ua MODERATE (*)    All other components within normal limits  LIPASE, BLOOD    EKG None  Radiology US OB Limited > 14 wks  Result Date: 02/08/2020 CLINICAL DATA:  29 year old pregnant female with abdominal pain. LMP: 06/23/2020 corresponding to an estimated gestational age of [redacted] weeks, 4 days. EXAM: LIMITED OBSTETRIC ULTRASOUND FINDINGS: Number of Fetuses: 1 Heart Rate:  137 bpm Movement: Detected Presentation: Cephalic Placental Location: Posterior Previa: The region of the internal os is suboptimally visualized and not evaluated. Amniotic Fluid (Subjective):  Within normal limits. BPD: 4.8 cm 20 w  3 d MATERNAL FINDINGS: Cervix:  Appears closed. Uterus/Adnexae: No abnormality visualized. IMPRESSION: Single live intrauterine pregnancy with an estimated gestational age of [redacted] weeks, 3 days. This exam is performed on an emergent basis and does not comprehensively evaluate fetal size, dating, or anatomy; follow-up  complete OB US should be considered if further fetal assessment is warranted. Electronically Signed   By: Elgie Collard M.D.   On: 02/08/2020 22:47   US Abdomen Limited RUQ  Result Date: 02/08/2020 CLINICAL DATA:  29 year old pregnant female with elevated LFTs. EXAM: ULTRASOUND ABDOMEN LIMITED RIGHT UPPER QUADRANT COMPARISON:  None. FINDINGS: Gallbladder: No gallstones or wall thickening visualized. No sonographic Murphy sign noted by sonographer. Common bile duct: Diameter: 3  mm Liver: No focal lesion identified. Within normal limits in parenchymal echogenicity. Portal vein is patent on color Doppler imaging with normal direction of blood flow towards the liver. Other: None. IMPRESSION: Unremarkable right upper quadrant ultrasound. Electronically Signed   By: Elgie Collard M.D.   On: 02/08/2020 22:04    Procedures Procedures (including critical care time)  Medications Ordered in ED Medications  sodium chloride flush (NS) 0.9 % injection 3 mL (has no administration in time range)    ED Course  I have reviewed the triage vital signs and the nursing notes.  Pertinent labs & imaging results that were available during my care of the patient were reviewed by me and considered in my medical decision making (see chart for details).    MDM Rules/Calculators/A&P                          Patient presents for evaluation of abdominal pain since yesterday. She is currently [redacted] weeks pregnant. She notes good fetal movement, denies vaginal bleeding or other associated symptoms including fevers, nausea, vomiting. She was seen 2 weeks ago for UTI but reports the symptoms have resolved. Abdomen soft, no rebound or guarding.  Lab work shows persistent and somewhat worsening leukocytosis. This is nonspecific, could be physiologic in the setting of her pregnancy. No anemia, no renal insufficiency. Her LFTs are mildly elevated today which appears to be acute compared to labs that were drawn 3 weeks ago.  Will obtain right upper quadrant ultrasound as well as a pelvic ultrasound for further evaluation as she cannot undergo CT scan due to pregnancy.  Imaging today shows no evidence of cholecystitis, hepatitis or other hepatobiliary dysfunction. Pelvic ultrasound confirms single live intrauterine pregnancy at gestation of around 20 weeks and 3 days, no serious abnormalities noted. Her UA does not suggest UTI and there is no bacteria so do not feel that she needs coverage for asymptomatic bacteriuria. Lipase is within normal limits, doubt pancreatitis. On reevaluation patient is resting comfortably. She is tolerating graham crackers in the ED. Serial abdominal examinations remain benign. I explained to her that work-up today is somewhat limited in the setting of her pregnancy however I have a low suspicion of acute surgical abdominal pathology including appendicitis given examination is nonfocal, reassuring and she has no other symptoms to suggest acute abdomen or serious infection. She will monitor her symptoms at home and we discussed strict ED return precautions. She also call her OB/GYN tomorrow for further recommendations. Discussed ED return precautions. Patient verbalized understanding of and agreement with plan and is safe for discharge home at this time. Discussed with Dr. Jacqulyn Bath who agrees with assessment and plan at this time.       Final Clinical Impression(s) / ED Diagnoses Final diagnoses:  Elevated LFTs  Generalized abdominal pain    Rx / DC Orders ED Discharge Orders    None       Bennye Alm 02/09/20 0009    Maia Plan, MD 02/09/20 2038

## 2020-02-08 NOTE — ED Triage Notes (Signed)
Patient here from home reporting sharp abd pain x2 days. Denies n/v, denies bleeding. [redacted] weeks pregnant.

## 2020-02-08 NOTE — Discharge Instructions (Signed)
Your work-up today was reassuring.  Take 1 to 2 tablets of extra strength Tylenol every 6 hours as needed for pain.  Take Tums for abdominal pain.  Avoid acidic foods, fatty foods, fried foods or alcohol.  Call your OB/GYN tomorrow for further recommendations.  Return to the emergency department if any concerning signs or symptoms develop such as fevers, vomiting, worsening pain or change to the location of the pain especially right lower quadrant.

## 2020-11-30 IMAGING — US ARTERIAL AND VENOUS ULTRASOUND OF THE ABDOMEN PELVIS AND SCROTUM
1 series · 13 of 25 positions shown · non-contrast
Comparison: None.

CLINICAL DATA: Initial evaluation for acute left lower quadrant
abdominal pain/pelvic pain.

EXAM:
TRANSABDOMINAL AND TRANSVAGINAL ULTRASOUND OF PELVIS
DOPPLER ULTRASOUND OF OVARIES
TECHNIQUE: Both transabdominal and transvaginal ultrasound examinations of the
pelvis were performed. Transabdominal technique was performed for
global imaging of the pelvis including uterus, ovaries, adnexal
regions, and pelvic cul-de-sac.
It was necessary to proceed with endovaginal exam following the
transabdominal exam to visualize the uterus, endometrium, and
ovaries. Color and duplex Doppler ultrasound was utilized to
evaluate blood flow to the ovaries.

[Series 1: arterial and venous ultrasound of the abdomen pelv · 44 acquisitions, 13 frames shown]
[im 1/44]
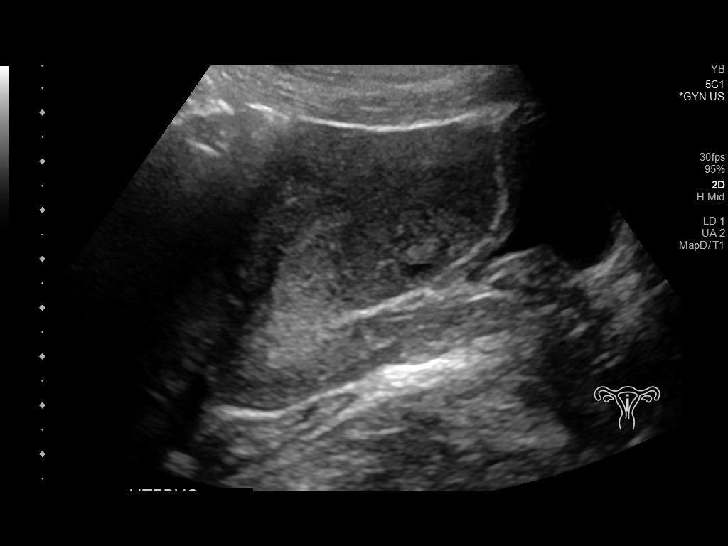
[im 4/44]
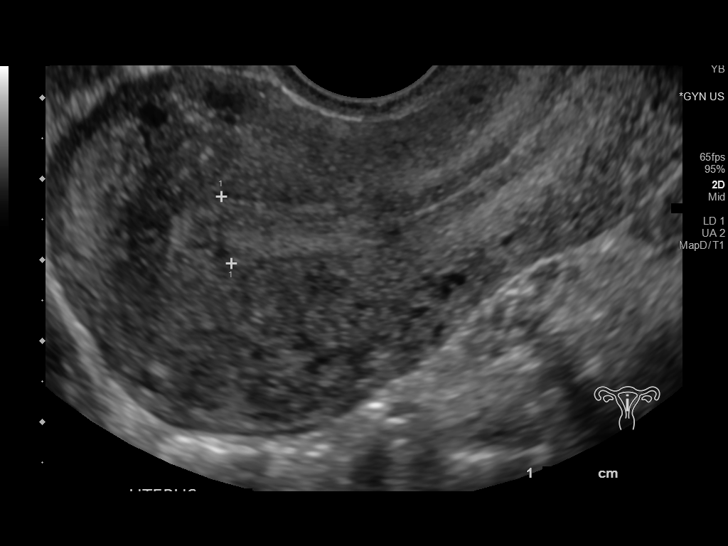
[im 8/44]
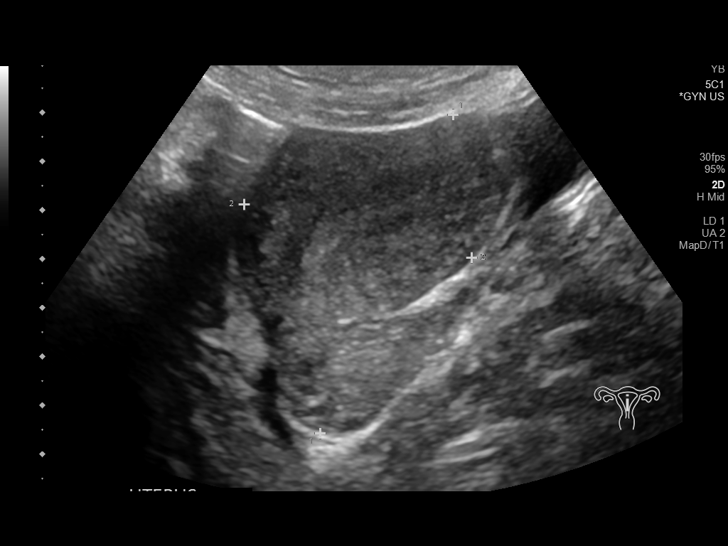
[im 11/44]
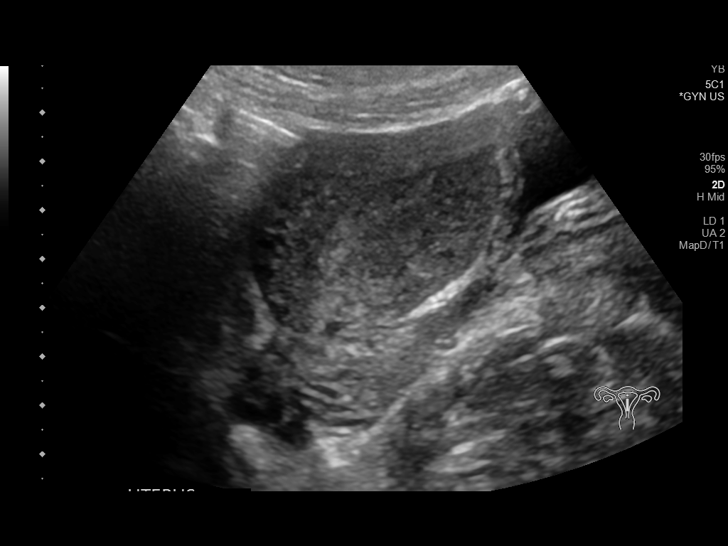
[im 15/44]
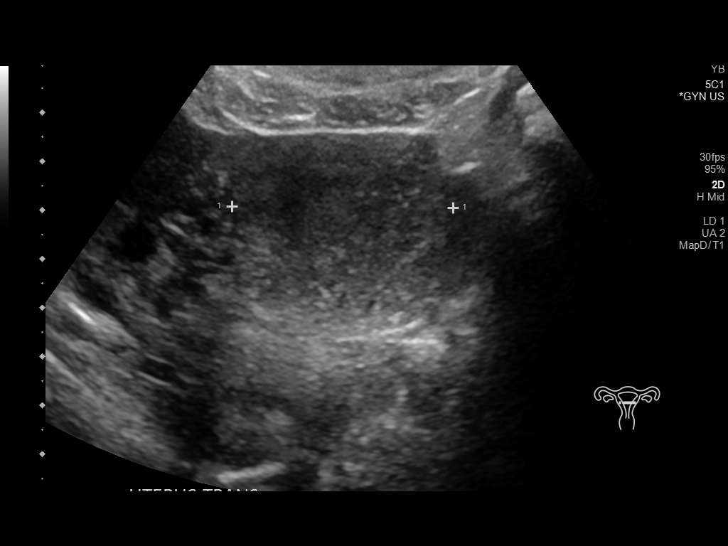
[im 18/44]
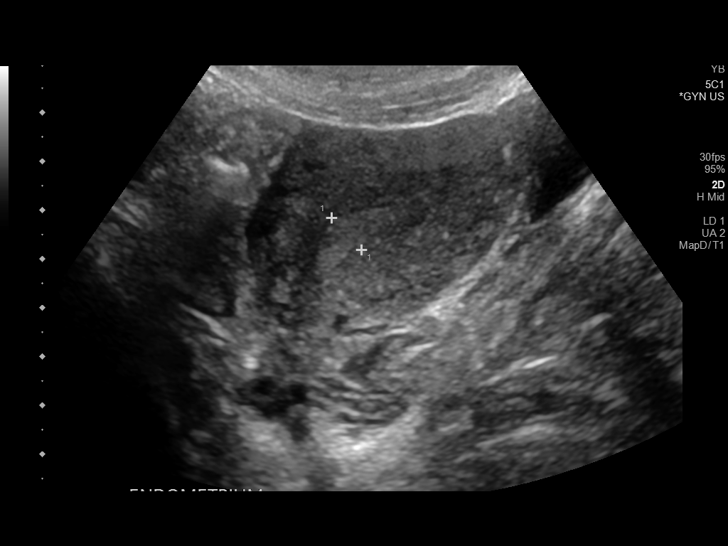
[im 22/44]
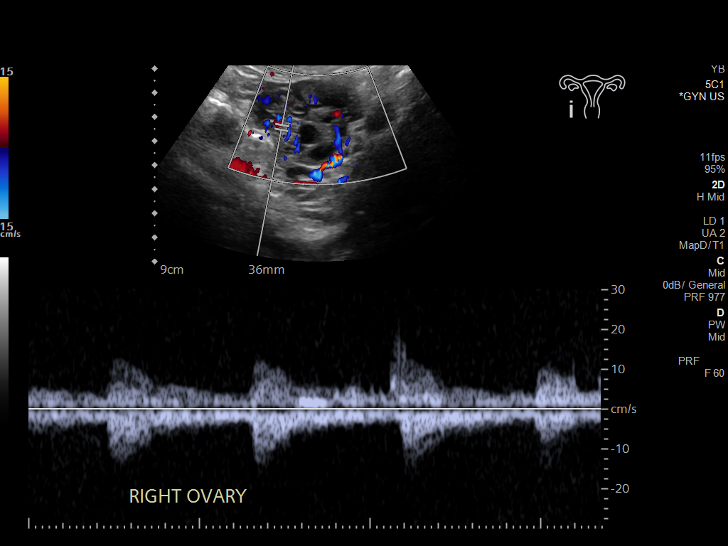
[im 26/44]
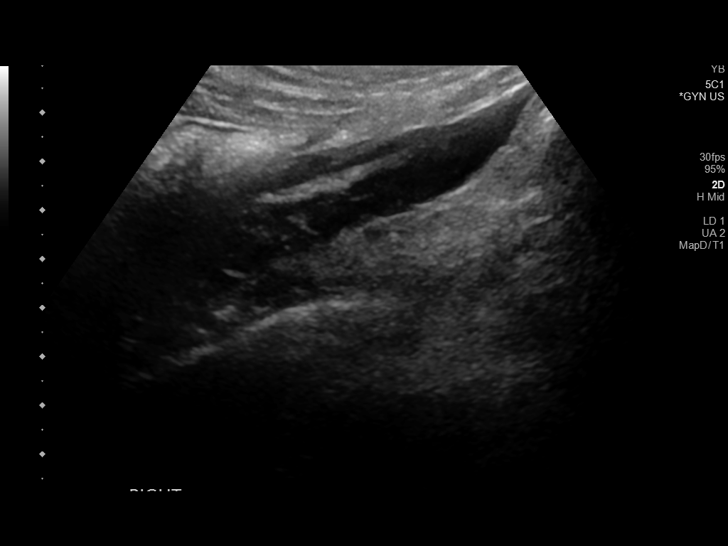
[im 29/44]
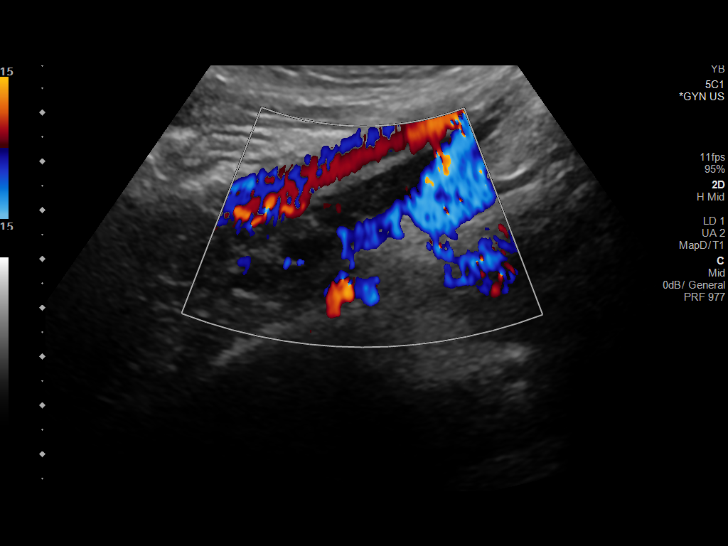
[im 33/44]
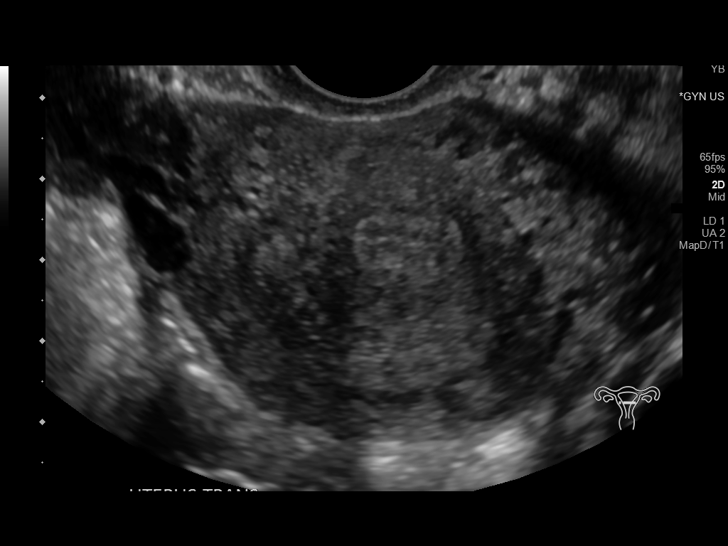
[im 36/44]
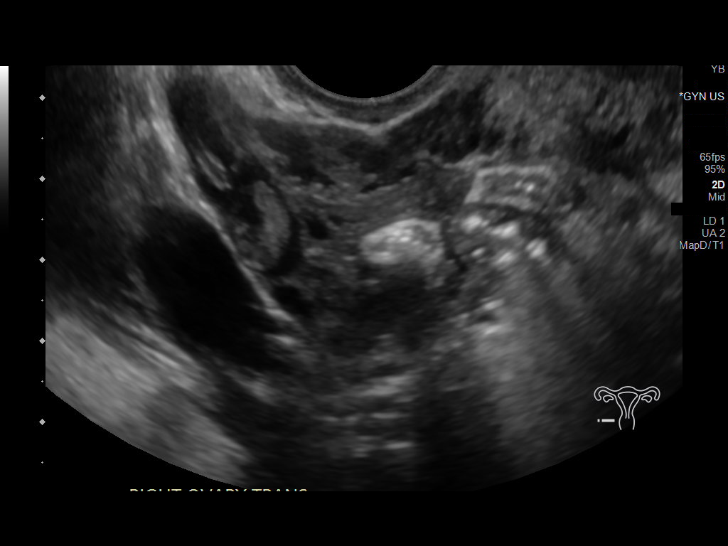
[im 40/44]
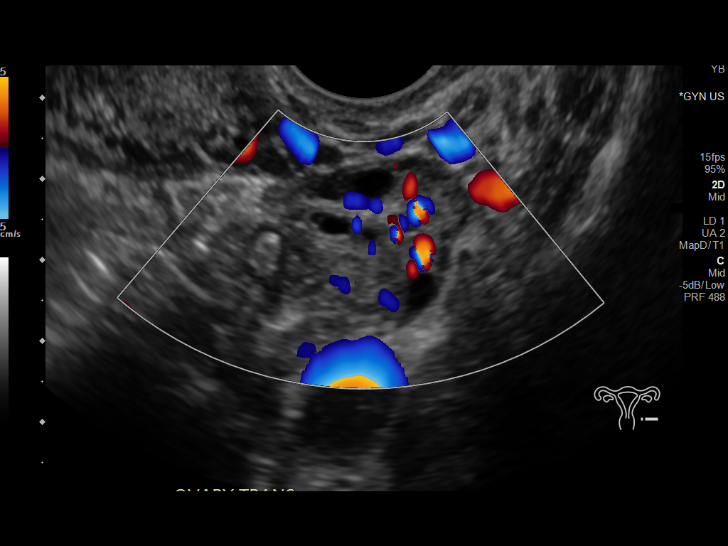
[im 44/44]
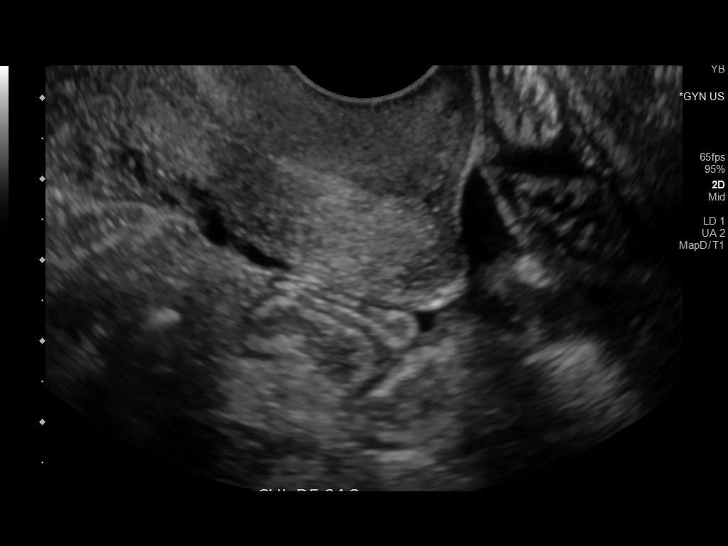

[13 of 25 positions shown; findings below may reference images not displayed]

FINDINGS: Uterus

Measurements: 8.2 x 4.7 x 5.8 cm = volume: 117 mL. No fibroids or
other mass visualized.

Endometrium

Thickness: 9 mm.  No focal abnormality visualized.

Right ovary

Measurements: 4.7 x 3.6 x 3.7 cm = volume: 32.7 mL. Normal
appearance/no adnexal mass.

Left ovary

Measurements: 3.3 x 2.6 x 2.3 cm = volume: 10.3 mL. Normal
appearance/no adnexal mass.

Pulsed Doppler evaluation of both ovaries demonstrates normal
low-resistance arterial and venous waveforms.

Other findings

Trace free physiologic fluid present within the pelvis, presumably
physiologic.
IMPRESSION: Normal pelvic ultrasound for age. No evidence for torsion or other
acute abnormality.

## 2021-05-26 ENCOUNTER — Encounter (HOSPITAL_COMMUNITY): Payer: Self-pay

## 2021-05-26 ENCOUNTER — Other Ambulatory Visit: Payer: Self-pay

## 2021-05-26 ENCOUNTER — Emergency Department (HOSPITAL_COMMUNITY)
Admission: EM | Admit: 2021-05-26 | Discharge: 2021-05-26 | Disposition: A | Discharge status: Home / Self Care | Payer: Medicaid Other | Attending: Emergency Medicine | Admitting: Emergency Medicine

## 2021-05-26 DIAGNOSIS — J069 Acute upper respiratory infection, unspecified: Secondary | ICD-10-CM | POA: Diagnosis not present

## 2021-05-26 DIAGNOSIS — F17210 Nicotine dependence, cigarettes, uncomplicated: Secondary | ICD-10-CM | POA: Diagnosis not present

## 2021-05-26 DIAGNOSIS — Z20822 Contact with and (suspected) exposure to covid-19: Secondary | ICD-10-CM | POA: Diagnosis not present

## 2021-05-26 DIAGNOSIS — R059 Cough, unspecified: Secondary | ICD-10-CM | POA: Diagnosis present

## 2021-05-26 LAB — RESP PANEL BY RT-PCR (FLU A&B, COVID) ARPGX2
Influenza A by PCR: NEGATIVE
Influenza B by PCR: NEGATIVE
SARS Coronavirus 2 by RT PCR: NEGATIVE

## 2021-05-26 NOTE — ED Provider Notes (Signed)
[facility]-EMERGENCY DEPT Provider Note   CSN: [id number] Arrival date & time: 05/26/21  6759     History Chief Complaint  Patient presents with   Cough    Chelsey Townsend is a 30 y.o. female. Patient presents with cough for 2 days now.  Is productive with some yellowish phlegm.  Says symptoms have worsened since Saturday.  Says she has associated congestion.  No associated fevers or chills.  Denies any ear pain, ear discharge, sore throat, difficulty breathing, chest pain.  Denies any GI symptoms.   Cough Associated symptoms: no chest pain, no chills, no ear pain, no fever, no headaches, no rash, no rhinorrhea, no shortness of breath and no sore throat       History reviewed. No pertinent past medical history.  There are no problems to display for this patient.   History reviewed. No pertinent surgical history.   OB History     Gravida  1   Para      Term      Preterm      AB      Living         SAB      IAB      Ectopic      Multiple      Live Births              Family History  Problem Relation Age of Onset   Hypertension Mother    Diabetes Mother    Hypertension Father    Diabetes Father     Social History   Tobacco Use   Smoking status: Some Days    Types: Cigarettes   Smokeless tobacco: Never  Vaping Use   Vaping Use: Never used  Substance Use Topics   Alcohol use: Never   Drug use: Never    Home Medications Prior to Admission medications   Medication Sig Start Date End Date Taking? Authorizing Provider  acetaminophen (TYLENOL) 500 MG tablet Take 500 mg by mouth every 6 (six) hours as needed for mild pain.    [provider]  cephALEXin (KEFLEX) 500 MG capsule Take 1 capsule (500 mg total) by mouth 2 (two) times daily. 01/27/20   Geoffery Lyons, MD  methocarbamol (ROBAXIN) 500 MG tablet Take 1 tablet (500 mg total) by mouth 2 (two) times daily. Patient not taking: Reported on 02/08/2020 02/22/19    Garlon Hatchet, PA-C  metroNIDAZOLE (FLAGYL) 500 MG tablet Take 1 tablet (500 mg total) by mouth 2 (two) times daily. Patient not taking: Reported on 02/08/2020 02/22/19   Garlon Hatchet, PA-C  Prenatal Vit-Fe Fumarate-FA (PRENATAL VITAMINS) 28-0.8 MG TABS Take 1 tablet by mouth daily.    [provider]    Allergies    Patient has no known allergies.  Review of Systems   Review of Systems  Constitutional:  Negative for chills and fever.  HENT:  Positive for congestion. Negative for ear discharge, ear pain, rhinorrhea, sinus pressure, sinus pain and sore throat.   Eyes:  Negative for visual disturbance.  Respiratory:  Positive for cough. Negative for chest tightness and shortness of breath.   Cardiovascular:  Negative for chest pain, palpitations and leg swelling.  Gastrointestinal:  Negative for abdominal pain, blood in stool, constipation, diarrhea, nausea and vomiting.  Genitourinary:  Negative for dysuria, flank pain and hematuria.  Musculoskeletal:  Negative for back pain.  Skin:  Negative for rash and wound.  Neurological:  Negative for dizziness, syncope, weakness,  light-headedness and headaches.  Psychiatric/Behavioral:  Negative for confusion.   All other systems reviewed and are negative.  Physical Exam Updated Vital Signs BP (!) 123/92 (BP Location: Left Arm)   Pulse 70   Temp 98.3 F (36.8 C) (Oral)   Resp 16   LMP 05/19/2021   SpO2 100%   Physical Exam Vitals and nursing note reviewed.  Constitutional:      General: She is not in acute distress.    Appearance: Normal appearance. She is not ill-appearing, toxic-appearing or diaphoretic.  HENT:     Head: Normocephalic and atraumatic.     Nose: No nasal deformity.     Mouth/Throat:     Lips: Pink. No lesions.     Mouth: Mucous membranes are moist. No injury, lacerations, oral lesions or angioedema.     Pharynx: Oropharynx is clear. Uvula midline. No pharyngeal swelling, oropharyngeal exudate, posterior  oropharyngeal erythema or uvula swelling.  Eyes:     General: Gaze aligned appropriately. No scleral icterus.       Right eye: No discharge.        Left eye: No discharge.     Conjunctiva/sclera: Conjunctivae normal.     Right eye: Right conjunctiva is not injected. No exudate or hemorrhage.    Left eye: Left conjunctiva is not injected. No exudate or hemorrhage. Cardiovascular:     Rate and Rhythm: Normal rate and regular rhythm.     Pulses: Normal pulses.          Radial pulses are 2+ on the right side and 2+ on the left side.       Dorsalis pedis pulses are 2+ on the right side and 2+ on the left side.     Heart sounds: Normal heart sounds, S1 normal and S2 normal. Heart sounds not distant. No murmur heard.   No friction rub. No gallop. No S3 or S4 sounds.  Pulmonary:     Effort: Pulmonary effort is normal. No accessory muscle usage or respiratory distress.     Breath sounds: Normal breath sounds. No stridor. No wheezing, rhonchi or rales.  Abdominal:     Palpations: There is no pulsatile mass.  Musculoskeletal:     Right lower leg: No edema.     Left lower leg: No edema.  Skin:    General: Skin is warm and dry.     Coloration: Skin is not jaundiced or pale.     Findings: No bruising, erythema, lesion or rash.  Neurological:     General: No focal deficit present.     Mental Status: She is alert and oriented to person, place, and time.     GCS: GCS eye subscore is 4. GCS verbal subscore is 5. GCS motor subscore is 6.  Psychiatric:        Mood and Affect: Mood normal.        Behavior: Behavior normal. Behavior is cooperative.    ED Results / Procedures / Treatments   Labs (all labs ordered are listed, but only abnormal results are displayed) Labs Reviewed  RESP PANEL BY RT-PCR (FLU A&B, COVID) [id number]    EKG None  Radiology No results found.  Procedures Procedures   Medications Ordered in ED Medications - No data to display  ED Course  I have reviewed the  triage vital signs and the nursing notes.  Pertinent labs & imaging results that were available during my care of the patient were reviewed by me and considered in my medical decision  making (see chart for details).    MDM Rules/Calculators/A&P                         Well-appearing 30 year old female with symptoms of upper respiratory virus for about 2 days.  She is afebrile and vital signs are stable.  Patient appears well and is in no acute distress.  Lung sounds are clear.  No exudates on tonsils. I suspect patient has upper respiratory illness.  I have ran a COVID and flu testing.  She does not wish to stay for the results.  I given her symptomatic treatment options that she can do at home.   Final Clinical Impression(s) / ED Diagnoses Final diagnoses:  Viral URI with cough    Rx / DC Orders ED Discharge Orders     None        Claudie Leach, PA-C 05/26/21 1151    Pollyann Savoy, MD 05/26/21 1243

## 2021-05-26 NOTE — ED Triage Notes (Signed)
Pt presents with c/o cough for 2 days.

## 2021-05-26 NOTE — Discharge Instructions (Addendum)
Treat your symptoms symptomatically. You can use over the counter cough and cold medications for symptoms.  Treat fevers with Tylenol/Ibuprofen Return if you develop worsening symptoms.

## 2021-11-16 IMAGING — US US ABDOMEN LIMITED
1 series · 14 of 25 positions shown · non-contrast
Comparison: None.

CLINICAL DATA: 29-year-old pregnant female with elevated LFTs.

EXAM:
ULTRASOUND ABDOMEN LIMITED RIGHT UPPER QUADRANT

[Series 1: us abdomen limited · 14 of 51 slices shown]
[im 1/51]
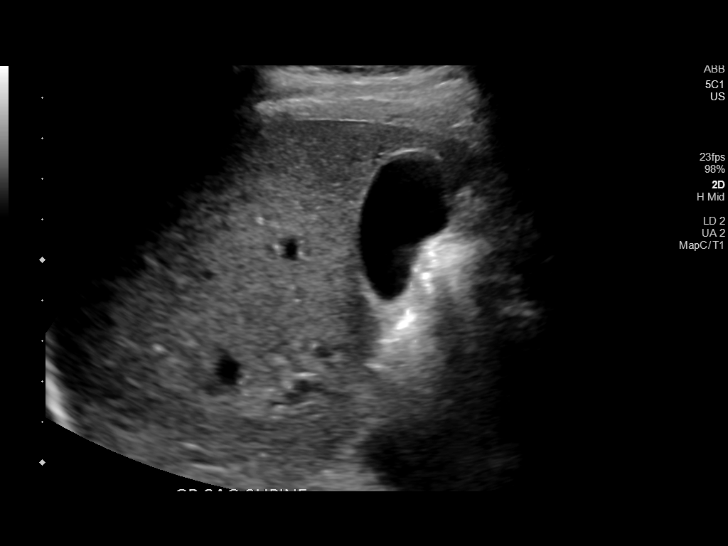
[im 5/51]
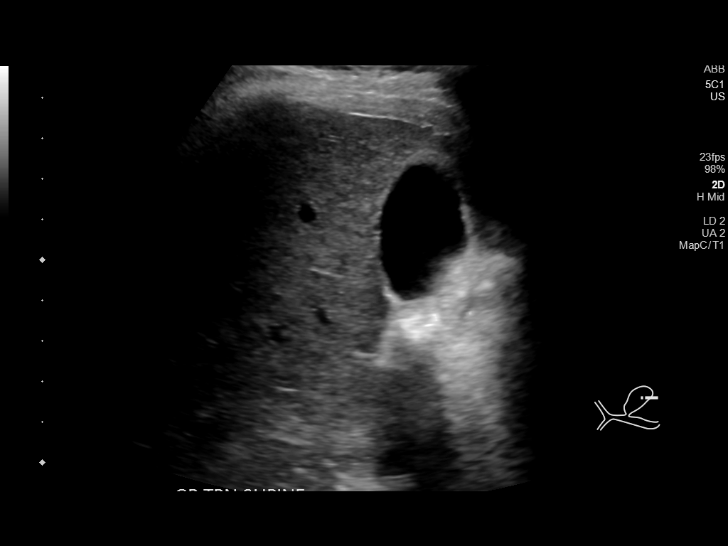
[im 9/51]
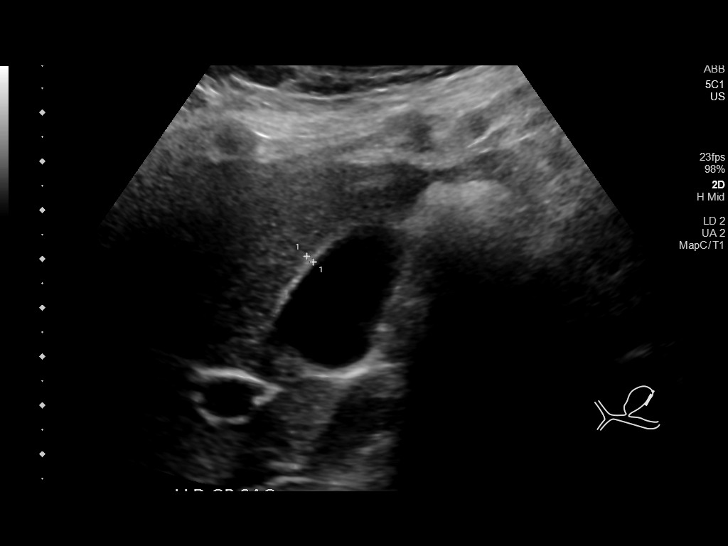
[im 13/51]
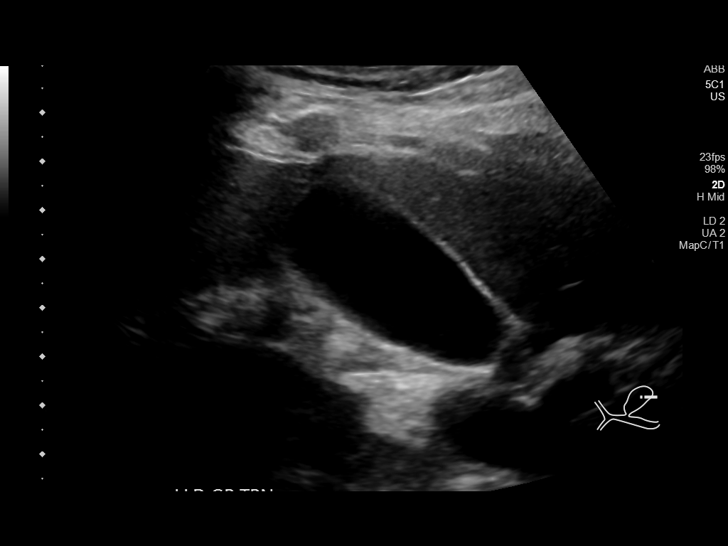
[im 17/51]
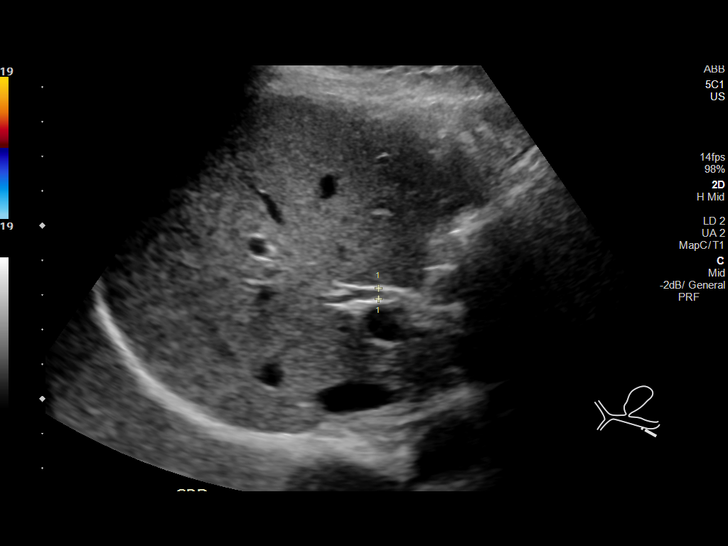
[im 19/51]
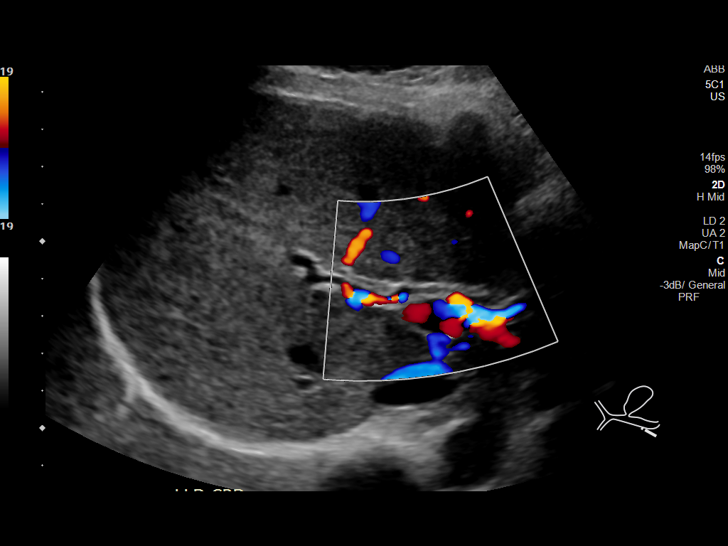
[im 23/51]
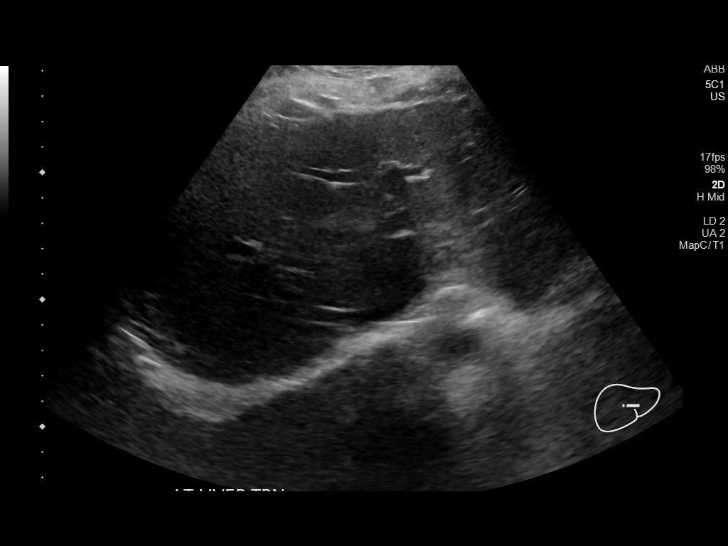
[im 28/51]
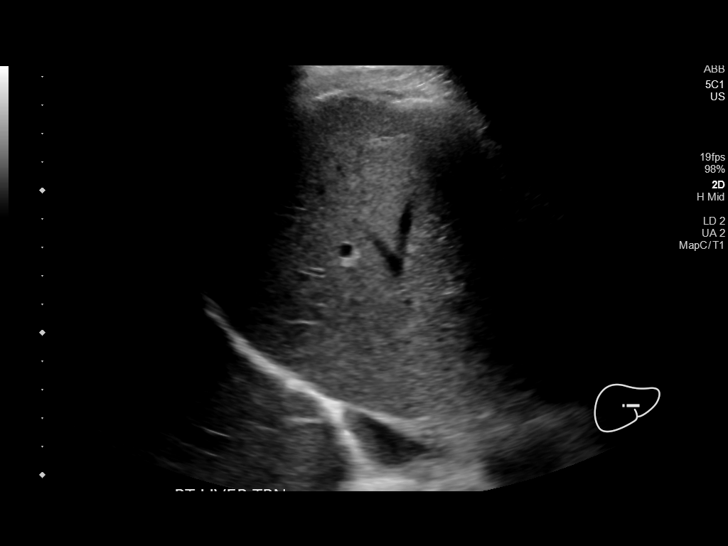
[im 32/51]
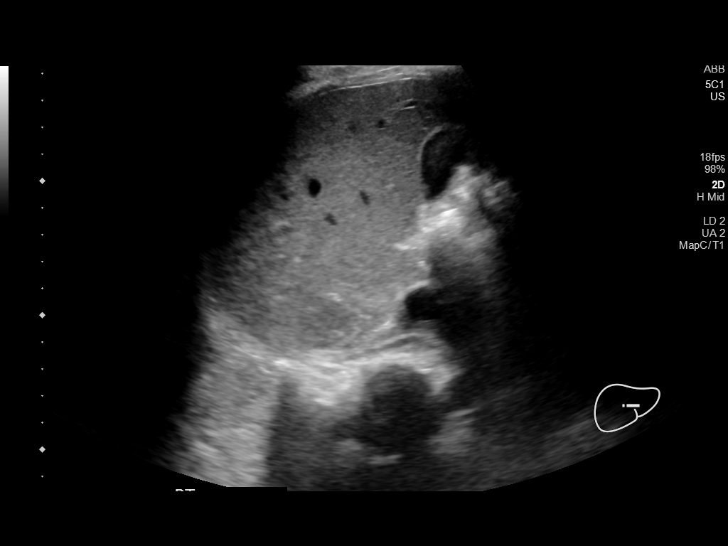
[im 34/51]
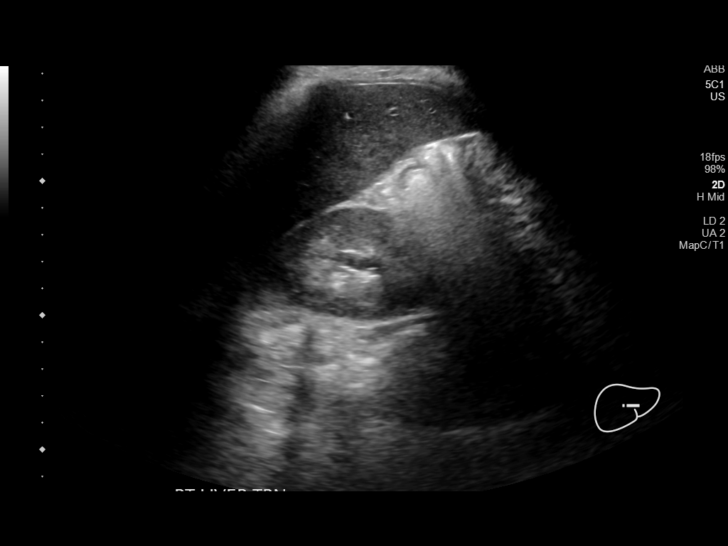
[im 38/51]
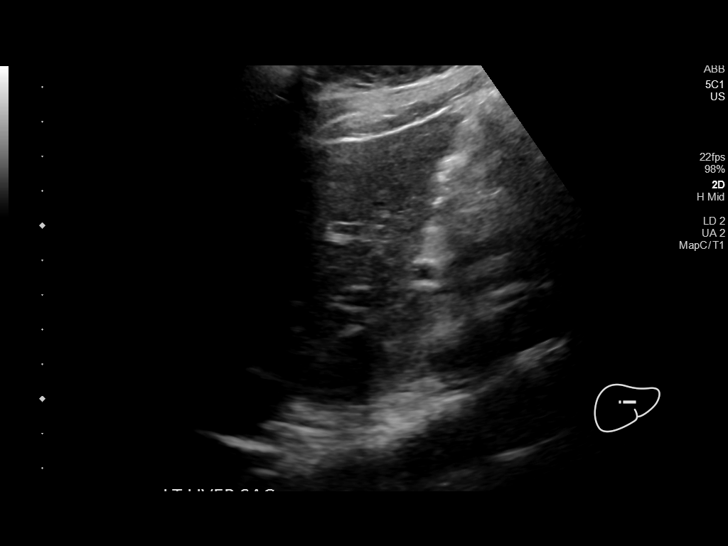
[im 42/51]
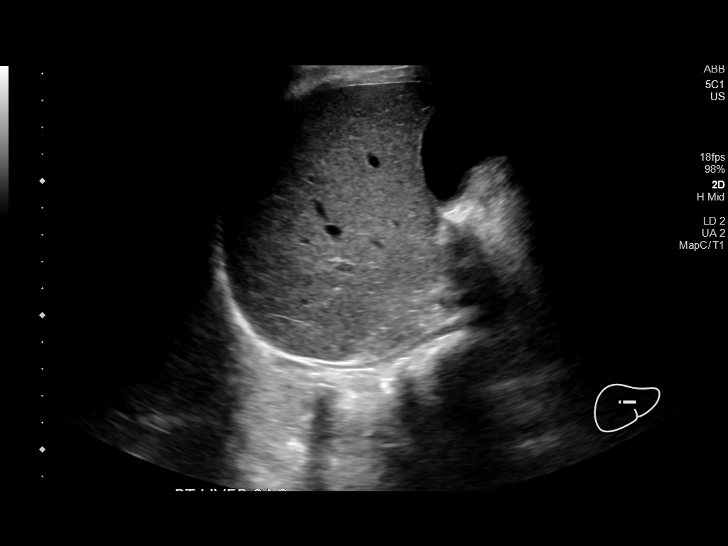
[im 46/51]
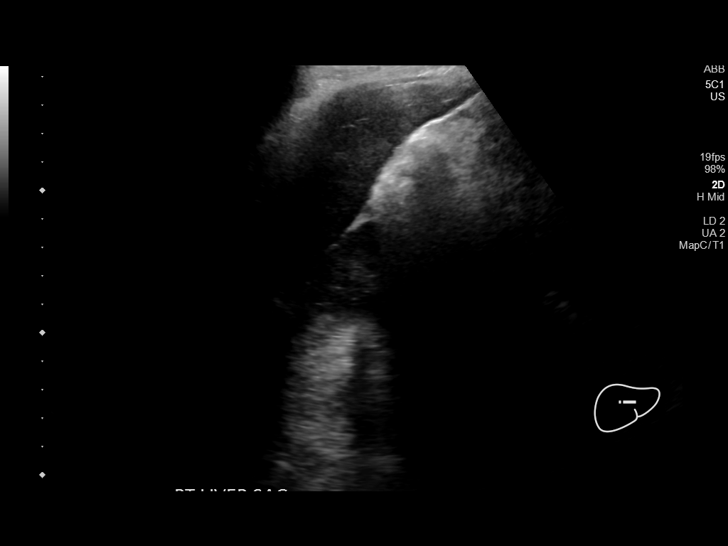
[im 51/51]
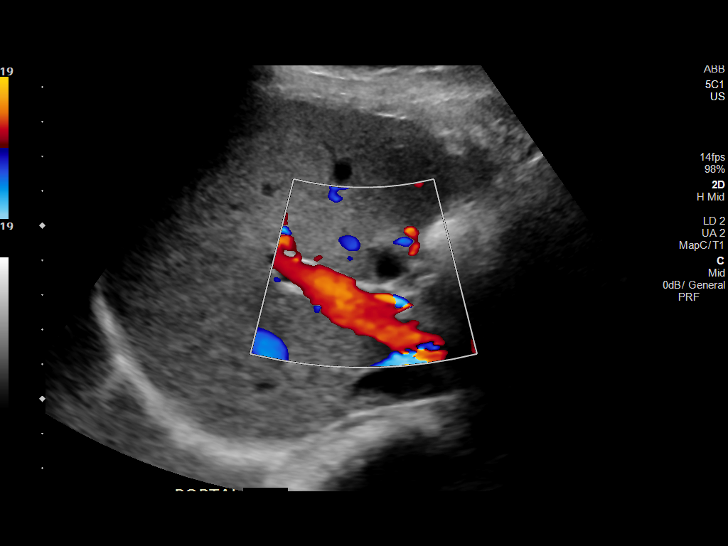

[14 of 25 positions shown; findings below may reference images not displayed]

FINDINGS: Gallbladder:

No gallstones or wall thickening visualized. No sonographic Murphy
sign noted by sonographer.

Common bile duct:

Diameter: 3 mm

Liver:

No focal lesion identified. Within normal limits in parenchymal
echogenicity. Portal vein is patent on color Doppler imaging with
normal direction of blood flow towards the liver.

Other: None.
IMPRESSION: Unremarkable right upper quadrant ultrasound.

## 2021-11-16 IMAGING — US US OB LIMITED
1 series · 14 of 21 positions shown · non-contrast
Comparison: none

CLINICAL DATA: 29-year-old pregnant female with abdominal pain.
LMP: 06/23/2020 corresponding to an estimated gestational age of 20
weeks, 4 days.

EXAM:
LIMITED OBSTETRIC ULTRASOUND

[Series 1: us ob limited · 21 acquisitions, 14 frames shown]
[im 1/21]
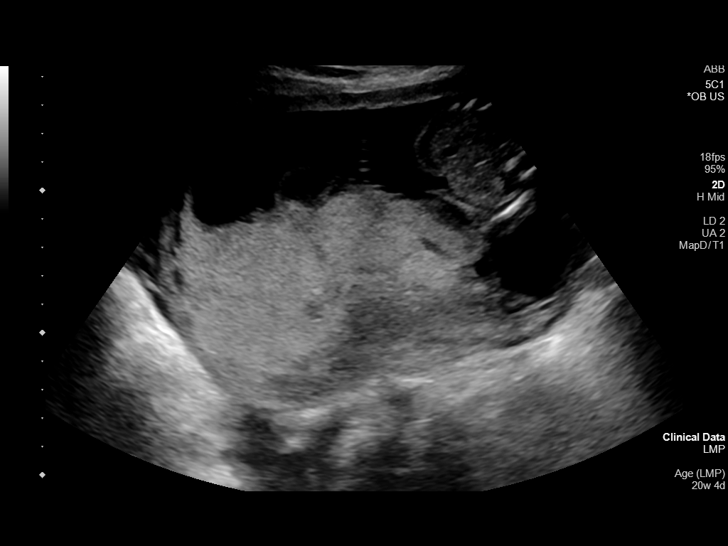
[im 3/21]
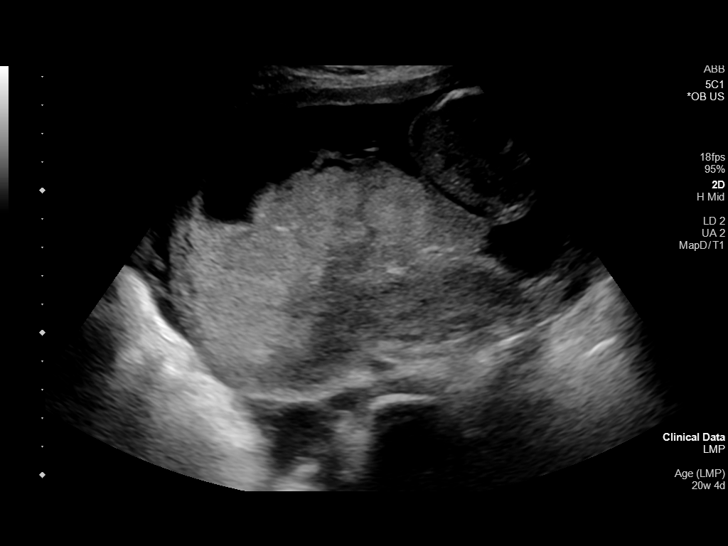
[im 4/21]
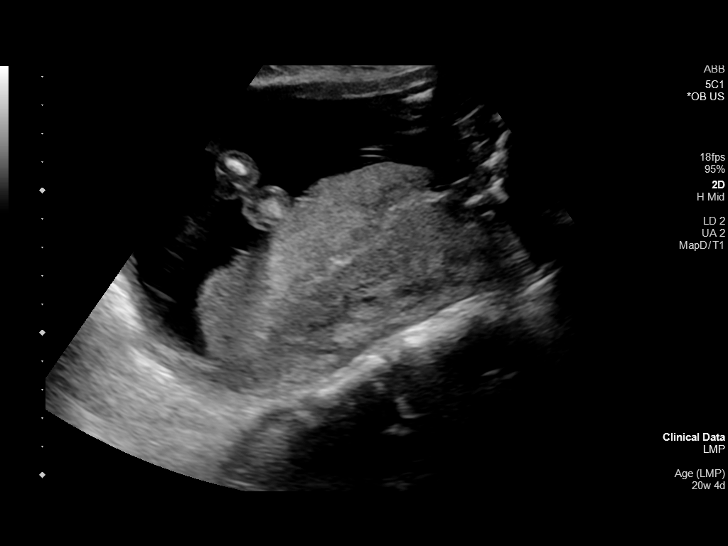
[im 6/21]
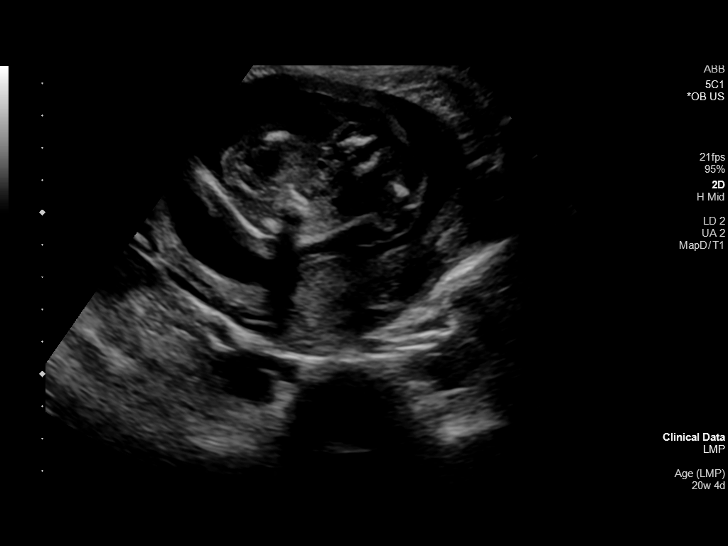
[im 7/21]
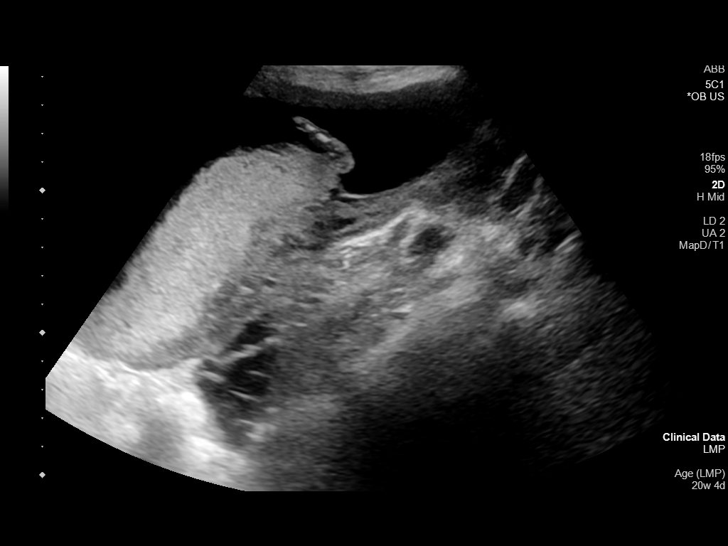
[im 9/21]
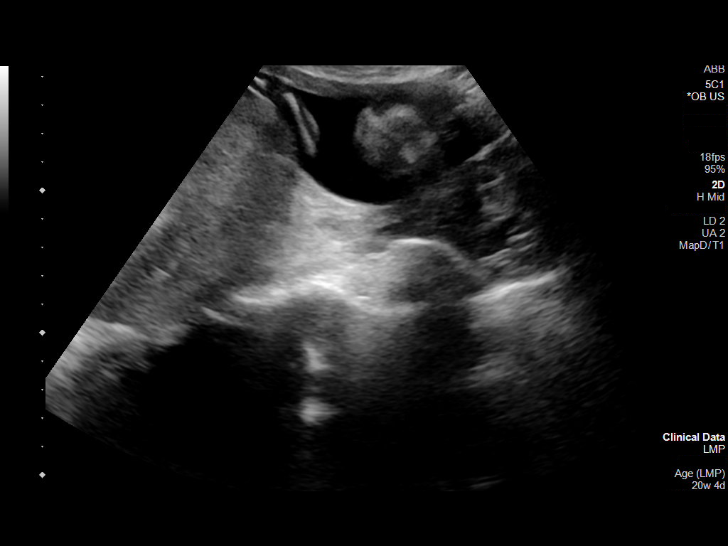
[im 10/21]
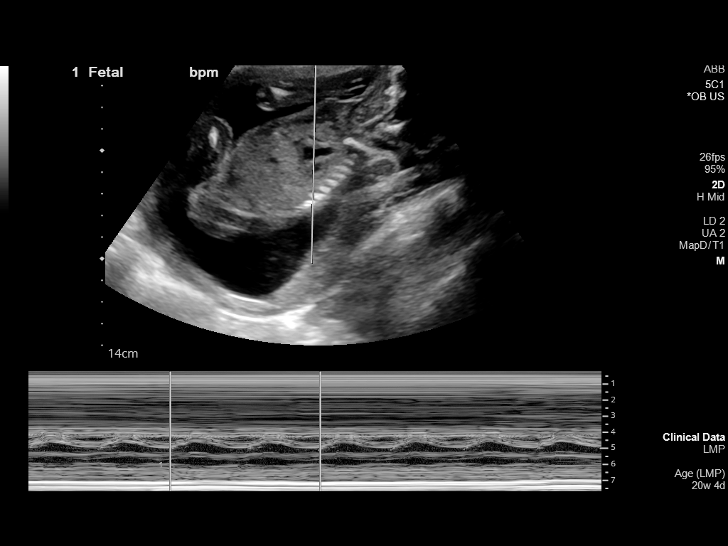
[im 12/21]
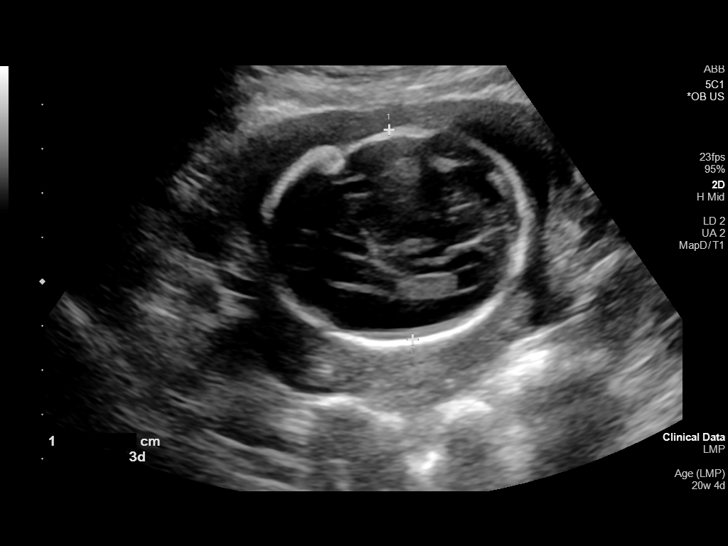
[im 13/21]
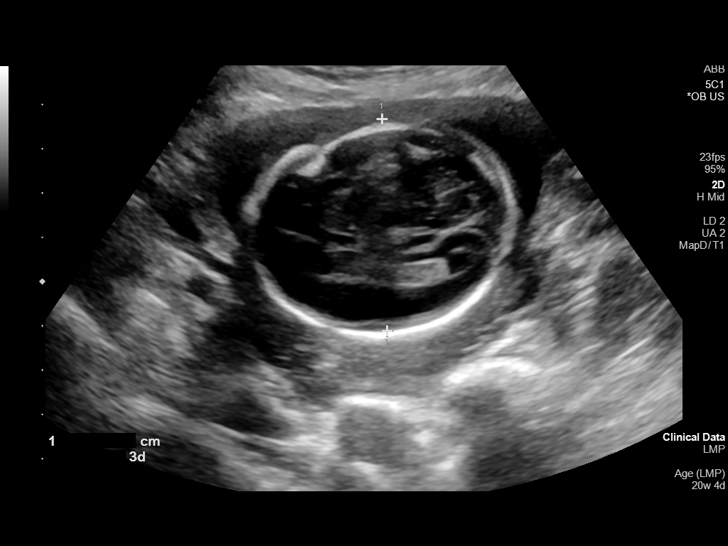
[im 15/21]
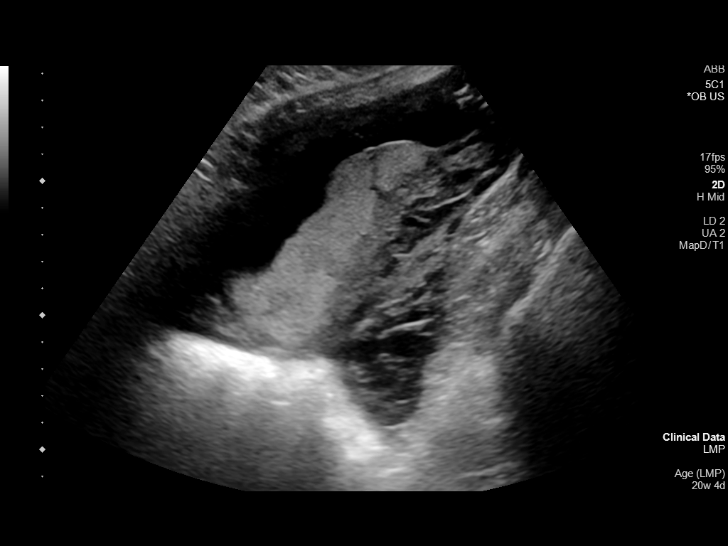
[im 16/21]
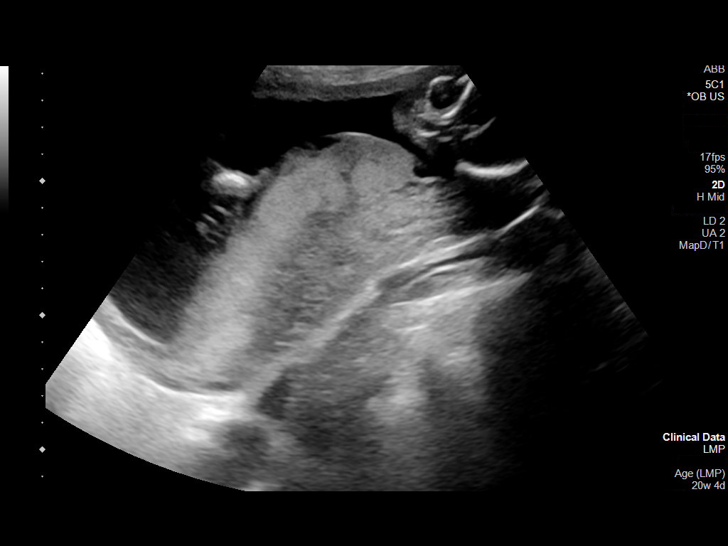
[im 18/21]
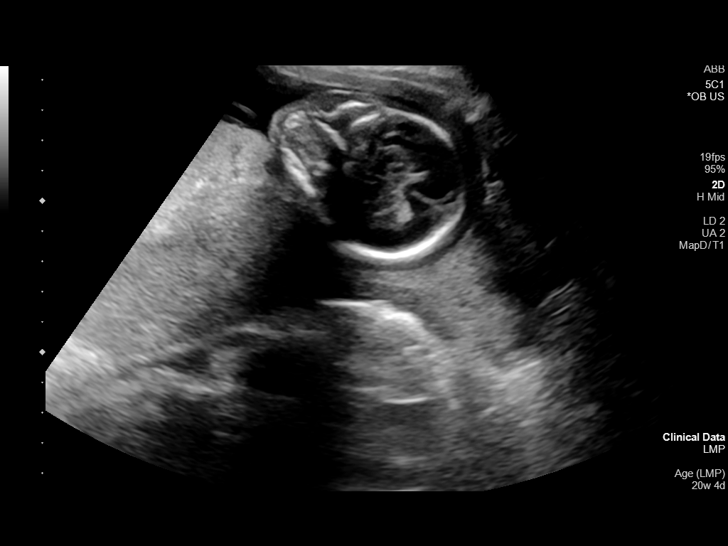
[im 19/21]
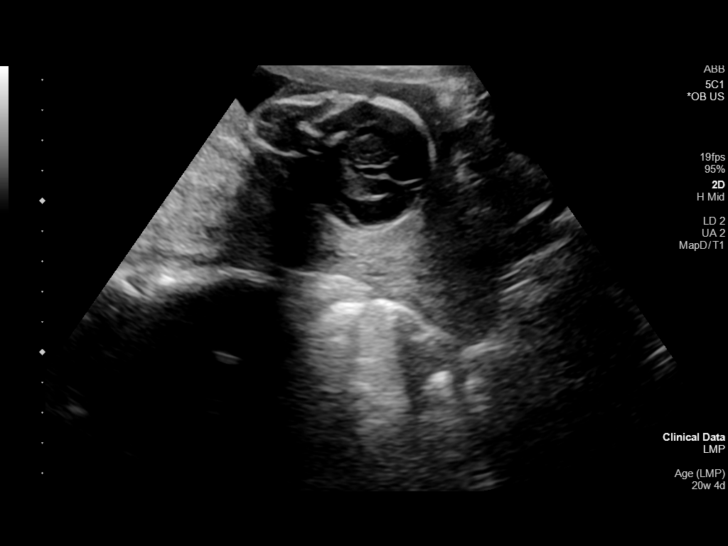
[im 21/21]
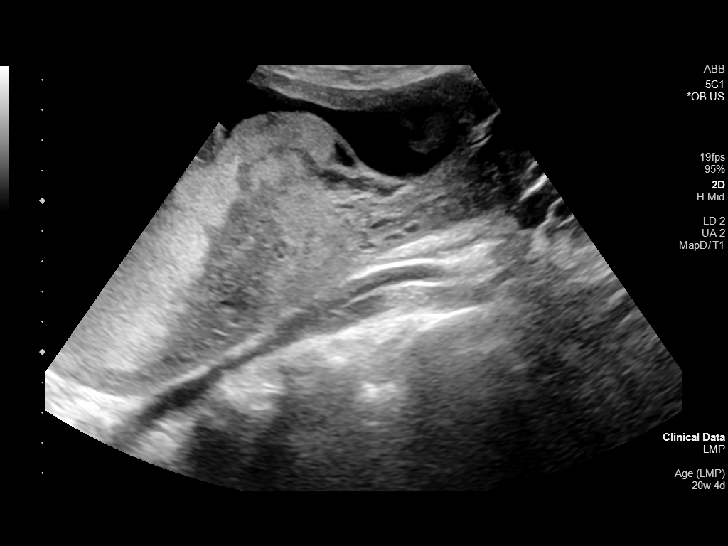

[14 of 21 positions shown; findings below may reference images not displayed]

FINDINGS: Number of Fetuses: 1

Heart Rate:  137 bpm

Movement: Detected

Presentation: Cephalic

Placental Location: Posterior

Previa: The region of the internal os is suboptimally visualized and
not evaluated.

Amniotic Fluid (Subjective):  Within normal limits.

BPD: 4.8 cm 20 w  3 d

MATERNAL FINDINGS:

Cervix:  Appears closed.

Uterus/Adnexae: No abnormality visualized.
IMPRESSION: Single live intrauterine pregnancy with an estimated gestational age
of 20 weeks, 3 days.

This exam is performed on an emergent basis and does not
comprehensively evaluate fetal size, dating, or anatomy; follow-up
complete OB US should be considered if further fetal assessment is
warranted.

## 2023-02-17 ENCOUNTER — Other Ambulatory Visit: Payer: Self-pay

## 2023-02-17 ENCOUNTER — Encounter (HOSPITAL_COMMUNITY): Payer: Self-pay

## 2023-02-17 ENCOUNTER — Emergency Department (HOSPITAL_COMMUNITY)
Admission: EM | Admit: 2023-02-17 | Discharge: 2023-02-18 | Disposition: A | Discharge status: Home / Self Care | Payer: Medicaid Other | Attending: Emergency Medicine | Admitting: Emergency Medicine

## 2023-02-17 DIAGNOSIS — O219 Vomiting of pregnancy, unspecified: Secondary | ICD-10-CM | POA: Diagnosis present

## 2023-02-17 DIAGNOSIS — E860 Dehydration: Secondary | ICD-10-CM | POA: Insufficient documentation

## 2023-02-17 DIAGNOSIS — Z3A01 Less than 8 weeks gestation of pregnancy: Secondary | ICD-10-CM | POA: Insufficient documentation

## 2023-02-17 DIAGNOSIS — D72829 Elevated white blood cell count, unspecified: Secondary | ICD-10-CM | POA: Insufficient documentation

## 2023-02-17 LAB — HEPATIC FUNCTION PANEL
ALT: 15 U/L (ref 0–44)
AST: 17 U/L (ref 15–41)
Albumin: 3.8 g/dL (ref 3.5–5.0)
Alkaline Phosphatase: 65 U/L (ref 38–126)
Bilirubin, Direct: <0.1 mg/dL (ref 0.0–0.2)
Total Bilirubin: 0.3 mg/dL (ref 0.3–1.2)
Total Protein: 7.9 g/dL (ref 6.5–8.1)

## 2023-02-17 LAB — BASIC METABOLIC PANEL
Anion gap: 8 (ref 5–15)
BUN: 9 mg/dL (ref 6–20)
CO2: 24 mmol/L (ref 22–32)
Calcium: 9 mg/dL (ref 8.9–10.3)
Chloride: 103 mmol/L (ref 98–111)
Creatinine, Ser: 0.75 mg/dL (ref 0.44–1.00)
GFR, Estimated: >60 mL/min (ref >60)
Glucose, Bld: 95 mg/dL (ref 70–99)
Potassium: 3.8 mmol/L (ref 3.5–5.1)
Sodium: 135 mmol/L (ref 135–145)

## 2023-02-17 LAB — CBC
HCT: 39.8 % (ref 36.0–46.0)
Hemoglobin: 12.7 g/dL (ref 12.0–15.0)
MCH: 29 pg (ref 26.0–34.0)
MCHC: 31.9 g/dL (ref 30.0–36.0)
MCV: 90.9 fL (ref 80.0–100.0)
Platelets: 294 10*3/uL (ref 150–400)
RBC: 4.38 MIL/uL (ref 3.87–5.11)
RDW: 13.2 % (ref 11.5–15.5)
WBC: 15.9 10*3/uL — ABNORMAL HIGH (ref 4.0–10.5)
nRBC: 0 % (ref 0.0–0.2)

## 2023-02-17 MED ORDER — ONDANSETRON HCL 4 MG/2ML IJ SOLN
4.0000 mg | Freq: Once | INTRAMUSCULAR | Status: AC
  Administered 2023-02-17: 4 mg via INTRAVENOUS
  Filled 2023-02-17: qty 2

## 2023-02-17 MED ORDER — LACTATED RINGERS IV BOLUS
2000.0000 mL | Freq: Once | INTRAVENOUS | Status: AC
  Administered 2023-02-17: 2000 mL via INTRAVENOUS

## 2023-02-17 MED ORDER — ONDANSETRON 8 MG PO TBDP: 8.0000 mg | ORAL_TABLET | Freq: Three times a day (TID) | ORAL | 0 refills | Status: AC | PRN

## 2023-02-17 NOTE — ED Triage Notes (Signed)
Pt states she is 5-6 weeks and has been having vomiting since last night. Unable to keep anything down. Denies use of nausea meds at home. C/o dizziness and severe nausea.

## 2023-02-17 NOTE — ED Provider Notes (Signed)
[place] EMERGENCY DEPARTMENT AT [facility] Provider Note   CSN: [id number] Arrival date & time: 02/17/23  1827     History  Chief Complaint  Patient presents with   Emesis During Pregnancy    Chelsey Townsend is a 32 y.o. female.  HPI   Patient states she is about 5 to [redacted] weeks pregnant.  She has been having trouble with persistent nausea and vomiting.  She has not been able to keep anything down.  She has not taken anything for her nausea.  Patient is now feeling dehydrated and lightheaded.  She still feels as if she has to vomit.  Last time she vomited she did notice blood-tinged emesis.  She has not had any blood in her stool.  Is not having abdominal pain.  No vaginal bleeding.  Home Medications Prior to Admission medications   Medication Sig Start Date End Date Taking? Authorizing Provider  ondansetron (ZOFRAN-ODT) 8 MG disintegrating tablet Take 1 tablet (8 mg total) by mouth every 8 (eight) hours as needed for nausea or vomiting. 02/17/23  Yes Linwood Dibbles, MD  Prenatal Vit-Fe Fumarate-FA (PRENATAL VITAMINS) 28-0.8 MG TABS Take 1 tablet by mouth daily.   Yes [provider]      Allergies    Patient has no known allergies.    Review of Systems   Review of Systems  Physical Exam Updated Vital Signs BP 118/78   Pulse 72   Temp 98.3 F (36.8 C) (Oral)   Resp 18   Ht 1.575 m (5\' 2" )   Wt 63.5 kg   SpO2 100%   BMI 25.61 kg/m  Physical Exam Vitals and nursing note reviewed.  Constitutional:      General: She is not in acute distress.    Appearance: She is well-developed.  HENT:     Head: Normocephalic and atraumatic.     Right Ear: External ear normal.     Left Ear: External ear normal.  Eyes:     General: No scleral icterus.       Right eye: No discharge.        Left eye: No discharge.     Conjunctiva/sclera: Conjunctivae normal.  Neck:     Trachea: No tracheal deviation.  Cardiovascular:     Rate and Rhythm: Normal rate and regular  rhythm.  Pulmonary:     Effort: Pulmonary effort is normal. No respiratory distress.     Breath sounds: Normal breath sounds. No stridor. No wheezing or rales.  Abdominal:     General: Bowel sounds are normal. There is no distension.     Palpations: Abdomen is soft.     Tenderness: There is no abdominal tenderness. There is no guarding or rebound.  Musculoskeletal:        General: No tenderness or deformity.     Cervical back: Neck supple.  Skin:    General: Skin is warm and dry.     Findings: No rash.  Neurological:     General: No focal deficit present.     Mental Status: She is alert.     Cranial Nerves: No cranial nerve deficit, dysarthria or facial asymmetry.     Sensory: No sensory deficit.     Motor: No abnormal muscle tone or seizure activity.     Coordination: Coordination normal.  Psychiatric:        Mood and Affect: Mood normal.     ED Results / Procedures / Treatments   Labs (all labs ordered are  listed, but only abnormal results are displayed) Labs Reviewed  CBC - Abnormal; Notable for the following components:      Result Value   WBC 15.9 (*)    All other components within normal limits  BASIC METABOLIC PANEL  HCG, SERUM, QUALITATIVE  HEPATIC FUNCTION PANEL  LIPASE, BLOOD    EKG None  Radiology No results found.  Procedures Procedures    Medications Ordered in ED Medications  lactated ringers bolus 2,000 mL (2,000 mLs Intravenous New Bag/Given 02/17/23 2259)  ondansetron (ZOFRAN) injection 4 mg (4 mg Intravenous Given 02/17/23 2259)    ED Course/ Medical Decision Making/ A&P                             Medical Decision Making Problems Addressed: Dehydration: acute illness or injury that poses a threat to life or bodily functions Nausea and vomiting during pregnancy prior to [redacted] weeks gestation: acute illness or injury that poses a threat to life or bodily functions  Amount and/or Complexity of Data Reviewed Labs: ordered. Decision-making  details documented in ED Course.  Risk Prescription drug management.   Presented ED with complaints of nausea vomiting.  Patient is in the first trimester of pregnancy.  She has not had any vaginal bleeding.  No abdominal pain.  Labs do not show any significant electrolyte abnormalities.  CBC shows leukocytosis but no anemia.  Will plan on IV fluids.  Anticipate discharge with antibiotics outpatient OB/GYN follow-up        Final Clinical Impression(s) / ED Diagnoses Final diagnoses:  Nausea and vomiting during pregnancy prior to [redacted] weeks gestation  Dehydration    Rx / DC Orders ED Discharge Orders          Ordered    ondansetron (ZOFRAN-ODT) 8 MG disintegrating tablet  Every 8 hours PRN        02/17/23 2331              Linwood Dibbles, MD 02/17/23 2332

## 2023-02-17 NOTE — Discharge Instructions (Signed)
Take the medications to help with your nausea.  Follow-up with an OB/GYN doctor for further evaluation

## 2023-02-18 LAB — HCG, SERUM, QUALITATIVE: Preg, Serum: POSITIVE — AB

## 2023-02-18 LAB — LIPASE, BLOOD: Lipase: 30 U/L (ref 11–51)

## 2023-02-18 NOTE — ED Provider Notes (Signed)
Blood pressure 118/78, pulse 72, temperature 98.3 F (36.8 C), temperature source Oral, resp. rate 18, height 5\' 2"  (1.575 m), weight 63.5 kg, SpO2 100 %, unknown if currently breastfeeding.  Assuming care from Dr. Lynelle Doctor.  In short, Chelsey Townsend is a 32 y.o. female with a chief complaint of Emesis During Pregnancy .  Refer to the original H&P for additional details.  The current plan of care is to follow up on labs and reassess.  12:55 AM  Patient feeling improved after treatment in the ED.  LFTs and bilirubin normal.  Lipase normal.  Leukocytosis without fever likely in the setting of hemoconcentration.  Serum qualitative positive for pregnancy.  She has plans to establish with OB in the next 2 weeks.     Maia Plan, MD 02/18/23 [phone]

## 2023-02-18 NOTE — ED Notes (Signed)
Pt educated on DC instructions, verbalize understanding, pt appears to be in NAD at this time, pt ambulatory from department.

## 2023-07-09 ENCOUNTER — Encounter (HOSPITAL_COMMUNITY): Payer: Self-pay | Admitting: Emergency Medicine

## 2023-07-09 ENCOUNTER — Inpatient Hospital Stay (HOSPITAL_COMMUNITY)
Admission: EM | Admit: 2023-07-09 | Discharge: 2023-07-10 | Disposition: A | Discharge status: Home / Self Care | Payer: Medicaid Other | Attending: Emergency Medicine | Admitting: Emergency Medicine

## 2023-07-09 ENCOUNTER — Other Ambulatory Visit: Payer: Self-pay

## 2023-07-09 DIAGNOSIS — O2312 Infections of bladder in pregnancy, second trimester: Secondary | ICD-10-CM | POA: Insufficient documentation

## 2023-07-09 DIAGNOSIS — O26899 Other specified pregnancy related conditions, unspecified trimester: Secondary | ICD-10-CM | POA: Diagnosis present

## 2023-07-09 DIAGNOSIS — O99282 Endocrine, nutritional and metabolic diseases complicating pregnancy, second trimester: Secondary | ICD-10-CM | POA: Diagnosis not present

## 2023-07-09 DIAGNOSIS — B3731 Acute candidiasis of vulva and vagina: Secondary | ICD-10-CM | POA: Diagnosis not present

## 2023-07-09 DIAGNOSIS — O4702 False labor before 37 completed weeks of gestation, second trimester: Secondary | ICD-10-CM | POA: Diagnosis not present

## 2023-07-09 DIAGNOSIS — E876 Hypokalemia: Secondary | ICD-10-CM | POA: Insufficient documentation

## 2023-07-09 DIAGNOSIS — O09292 Supervision of pregnancy with other poor reproductive or obstetric history, second trimester: Secondary | ICD-10-CM | POA: Diagnosis not present

## 2023-07-09 DIAGNOSIS — Z3A26 26 weeks gestation of pregnancy: Secondary | ICD-10-CM | POA: Insufficient documentation

## 2023-07-09 DIAGNOSIS — N858 Other specified noninflammatory disorders of uterus: Secondary | ICD-10-CM | POA: Diagnosis not present

## 2023-07-09 DIAGNOSIS — Z3689 Encounter for other specified antenatal screening: Secondary | ICD-10-CM | POA: Diagnosis not present

## 2023-07-09 DIAGNOSIS — O98812 Other maternal infectious and parasitic diseases complicating pregnancy, second trimester: Secondary | ICD-10-CM | POA: Diagnosis not present

## 2023-07-09 DIAGNOSIS — O4700 False labor before 37 completed weeks of gestation, unspecified trimester: Secondary | ICD-10-CM

## 2023-07-09 DIAGNOSIS — O34219 Maternal care for unspecified type scar from previous cesarean delivery: Secondary | ICD-10-CM | POA: Diagnosis not present

## 2023-07-09 DIAGNOSIS — R109 Unspecified abdominal pain: Secondary | ICD-10-CM | POA: Diagnosis present

## 2023-07-09 DIAGNOSIS — B379 Candidiasis, unspecified: Secondary | ICD-10-CM

## 2023-07-09 DIAGNOSIS — N3000 Acute cystitis without hematuria: Secondary | ICD-10-CM

## 2023-07-09 HISTORY — DX: Other specified health status: Z78.9

## 2023-07-09 LAB — CBC WITH DIFFERENTIAL/PLATELET
Abs Immature Granulocytes: 0.85 10*3/uL — ABNORMAL HIGH (ref 0.00–0.07)
Basophils Absolute: 0.1 10*3/uL (ref 0.0–0.1)
Basophils Relative: 1 %
Eosinophils Absolute: 0.2 10*3/uL (ref 0.0–0.5)
Eosinophils Relative: 1 %
HCT: 33.5 % — ABNORMAL LOW (ref 36.0–46.0)
Hemoglobin: 10.8 g/dL — ABNORMAL LOW (ref 12.0–15.0)
Immature Granulocytes: 5 %
Lymphocytes Relative: 25 %
Lymphs Abs: 4.4 10*3/uL — ABNORMAL HIGH (ref 0.7–4.0)
MCH: 28.4 pg (ref 26.0–34.0)
MCHC: 32.2 g/dL (ref 30.0–36.0)
MCV: 88.2 fL (ref 80.0–100.0)
Monocytes Absolute: 1.5 10*3/uL — ABNORMAL HIGH (ref 0.1–1.0)
Monocytes Relative: 9 %
Neutro Abs: 10.4 10*3/uL — ABNORMAL HIGH (ref 1.7–7.7)
Neutrophils Relative %: 59 %
Platelets: 231 10*3/uL (ref 150–400)
RBC: 3.8 MIL/uL — ABNORMAL LOW (ref 3.87–5.11)
RDW: 13.5 % (ref 11.5–15.5)
WBC: 17.5 10*3/uL — ABNORMAL HIGH (ref 4.0–10.5)
nRBC: 0.1 % (ref 0.0–0.2)

## 2023-07-09 LAB — BASIC METABOLIC PANEL
Anion gap: 9 (ref 5–15)
BUN: 11 mg/dL (ref 6–20)
CO2: 21 mmol/L — ABNORMAL LOW (ref 22–32)
Calcium: 8.8 mg/dL — ABNORMAL LOW (ref 8.9–10.3)
Chloride: 103 mmol/L (ref 98–111)
Creatinine, Ser: 0.62 mg/dL (ref 0.44–1.00)
GFR, Estimated: >60 mL/min (ref >60)
Glucose, Bld: 94 mg/dL (ref 70–99)
Potassium: 3.3 mmol/L — ABNORMAL LOW (ref 3.5–5.1)
Sodium: 133 mmol/L — ABNORMAL LOW (ref 135–145)

## 2023-07-09 MED ORDER — SODIUM CHLORIDE 0.9 % IV BOLUS
1000.0000 mL | Freq: Once | INTRAVENOUS | Status: AC
  Administered 2023-07-09: 1000 mL via INTRAVENOUS

## 2023-07-09 NOTE — Progress Notes (Signed)
2320: O[facility]RN called to [facility] for pt approx 25 weeks with c/o ctx.  2338Isidore Townsend at bedside, pt placed on monitor. Pt states ctx started about an hour ago, having every for a while but stopped counting. Endorses fetal movement, no bleeding or LOF. Does c/o urinary discomfort/pressure states "I feel like I have to go sometimes, but sometimes I can't tell. I sometimes feel like I have to go right after I already went."   2359: Dr. Charlotta Newton called and notified of pt G5P4, 26.1 wks presented to [facility] w/ complaint of ctx x1hr about 2 min. Apart. Endorses fetal movement, no bleeding or LOF. Does c/o urinary discomfort/pressure. Monitoring consistent with complaint UI/ctx q2 min. Abdomen soft to palpation, NST reactive. SVE .5/thick, small to mod discharge- white. Hx of pre-term labor x2, hx of prior c/s x1. Sees [facility] for [facility]. Orders to transfer to MAU for further evaluation.   0012: UA sent.  0014: MAU called and given report on pt.   0059: [facility] at bedside, pt removed from monitor.

## 2023-07-09 NOTE — ED Provider Notes (Signed)
Emergency Department Provider Note   I have reviewed the triage vital signs and the nursing notes.   HISTORY  Chief Complaint Contractions   HPI Chelsey Townsend is a 32 y.o. female [phone] at 26w presents to the emergency department with lower abdominal cramping pain radiating into the back.  She reports intermittent pain which is now coming back to back.  She is concerned that she may be in early labor.  Denies any rush of fluid or bleeding.  No fevers.  No chest pain or shortness of breath.  She is followed by GYN in the [facility] system (Dr. Tyson Alias).    Past Medical History:  Diagnosis Date   Medical history non-contributory     Review of Systems  Constitutional: No fever/chills Eyes: No visual changes. ENT: No sore throat. Cardiovascular: Denies chest pain. Respiratory: Denies shortness of breath. Gastrointestinal: Cramping lower abdominal pain.  No nausea, no vomiting.  No diarrhea.  Genitourinary: Negative for dysuria. Musculoskeletal: Negative for back pain. Skin: Negative for rash. Neurological: Negative for headaches. ____________________________________________   PHYSICAL EXAM:  VITAL SIGNS: Vitals:   07/10/23 0350 07/10/23 0403  BP:  118/64  Pulse:  87  Resp:  17  Temp:    SpO2: 100% 100%    Constitutional: Alert and oriented. Well appearing and in no acute distress. Eyes: Conjunctivae are normal.  Head: Atraumatic. Nose: No congestion/rhinnorhea. Mouth/Throat: Mucous membranes are moist.  Neck: No stridor.   Cardiovascular: Normal rate, regular rhythm. Good peripheral circulation. Grossly normal heart sounds.   Respiratory: Normal respiratory effort.  No retractions. Lungs CTAB. Gastrointestinal: Soft and nontender. No distention.  Musculoskeletal: No lower extremity tenderness nor edema. No gross deformities of extremities. Neurologic:  Normal speech and language. No gross focal neurologic deficits are appreciated.  Skin:  Skin is warm, dry and  intact. No rash noted.  ____________________________________________   LABS (all labs ordered are listed, but only abnormal results are displayed)  Labs Reviewed  WET PREP, GENITAL - Abnormal; Notable for the following components:      Result Value   Yeast Wet Prep HPF POC PRESENT (*)    WBC, Wet Prep HPF POC >=10 (*)    All other components within normal limits  BASIC METABOLIC PANEL - Abnormal; Notable for the following components:   Sodium 133 (*)    Potassium 3.3 (*)    CO2 21 (*)    Calcium 8.8 (*)    All other components within normal limits  CBC WITH DIFFERENTIAL/PLATELET - Abnormal; Notable for the following components:   WBC 17.5 (*)    RBC 3.80 (*)    Hemoglobin 10.8 (*)    HCT 33.5 (*)    Neutro Abs 10.4 (*)    Lymphs Abs 4.4 (*)    Monocytes Absolute 1.5 (*)    Abs Immature Granulocytes 0.85 (*)    All other components within normal limits  HCG, QUANTITATIVE, PREGNANCY - Abnormal; Notable for the following components:   hCG, Beta Chain, Quant, S 1,472 (*)    All other components within normal limits  URINALYSIS, ROUTINE W REFLEX MICROSCOPIC - Abnormal; Notable for the following components:   APPearance HAZY (*)    Leukocytes,Ua LARGE (*)    All other components within normal limits  CULTURE, OB URINE  GC/CHLAMYDIA PROBE AMP ([place]) NOT AT [facility]   ____________________________________________   PROCEDURES  Procedure(s) performed:   Procedures  None  ____________________________________________   INITIAL IMPRESSION / ASSESSMENT AND PLAN / ED COURSE  Pertinent labs & imaging results that were available during my care of the patient were reviewed by me and considered in my medical decision making (see chart for details).   This patient is Presenting for Evaluation of abdominal pain, which does require a range of treatment options, and is a complaint that involves a high risk of morbidity and mortality.  The Differential Diagnoses include PPROM,  braxton-hicks, appendicitis, PID, etc.  Critical Interventions-    Medications  NIFEdipine (PROCARDIA) capsule 10 mg (10 mg Oral Given 07/10/23 0243)  sodium chloride 0.9 % bolus 1,000 mL (0 mLs Intravenous Stopped 07/10/23 0058)  potassium chloride SA (KLOR-CON M) CR tablet 20 mEq (20 mEq Oral Given 07/10/23 0257)  cefTRIAXone (ROCEPHIN) 2 g in sodium chloride 0.9 % 100 mL IVPB (0 g Intravenous Stopped 07/10/23 0338)  cyclobenzaprine (FLEXERIL) tablet 10 mg (10 mg Oral Given 07/10/23 0257)    Reassessment after intervention:  contractions decreased in intensity and frequency after IVF.  I decided to review pertinent External Data, and in summary patient followed by OB in the [facility] system.    Clinical Laboratory Tests Ordered, included basic metabolic panel with mild hypokalemia 3.3.  Leukocytosis to 17 5, likely hemoconcentration.  Leukocytes on UA without bacteria.  Consult complete with OB. Dr. Charlotta Newton. Plan for transfer to MAU for monitoring.   Medical Decision Making: Summary:  Patient arrives to the emergency department with concern for contractions.  She is currently [redacted] weeks pregnant.  No imminent delivery on my initial assessment.  Have contacted [facility]. Will start with labs, UA, and IVF.   Patient's presentation is most consistent with acute presentation with potential threat to life or bodily function.   Disposition: discharge  ____________________________________________  FINAL CLINICAL IMPRESSION(S) / ED DIAGNOSES  Final diagnoses:  Preterm contractions  Acute cystitis without hematuria  Yeast infection  Hypokalemia  NST (non-stress test) reactive  [redacted] weeks gestation of pregnancy     NEW OUTPATIENT MEDICATIONS STARTED DURING THIS VISIT:  Discharge Medication List as of 07/10/2023  4:19 AM     START taking these medications   Details  miconazole (MONISTAT 7) 2 % vaginal cream Place 1 Applicatorful vaginally at bedtime. Apply for seven nights, Starting Fri  07/10/2023, Normal    nitrofurantoin, macrocrystal-monohydrate, (MACROBID) 100 MG capsule Take 1 capsule (100 mg total) by mouth 2 (two) times daily., Starting Fri 07/10/2023, Normal    NIFEdipine (PROCARDIA) 10 MG capsule Take 1 capsule (10 mg total) by mouth every 4 (four) hours as needed (contractions)., Starting Fri 07/10/2023, Normal        Note:  This document was prepared using Dragon voice recognition software and may include unintentional dictation errors.  Alona Bene, MD, [facility] Emergency [facility]    Shoshannah Faubert, Arlyss Repress, MD 07/10/23 [phone]

## 2023-07-09 NOTE — ED Triage Notes (Signed)
Pt reports contractions beginning approx 1 hour ago. States they are about 30 secs apart. This is her 4th child. Pt reports she is approx [redacted] weeks pregnant

## 2023-07-10 ENCOUNTER — Encounter (HOSPITAL_COMMUNITY): Payer: Self-pay

## 2023-07-10 DIAGNOSIS — N858 Other specified noninflammatory disorders of uterus: Secondary | ICD-10-CM | POA: Diagnosis not present

## 2023-07-10 DIAGNOSIS — B3731 Acute candidiasis of vulva and vagina: Secondary | ICD-10-CM | POA: Diagnosis not present

## 2023-07-10 DIAGNOSIS — E876 Hypokalemia: Secondary | ICD-10-CM | POA: Diagnosis not present

## 2023-07-10 DIAGNOSIS — O34219 Maternal care for unspecified type scar from previous cesarean delivery: Secondary | ICD-10-CM | POA: Diagnosis not present

## 2023-07-10 DIAGNOSIS — R109 Unspecified abdominal pain: Secondary | ICD-10-CM | POA: Diagnosis present

## 2023-07-10 DIAGNOSIS — Z3A26 26 weeks gestation of pregnancy: Secondary | ICD-10-CM | POA: Diagnosis not present

## 2023-07-10 DIAGNOSIS — O99282 Endocrine, nutritional and metabolic diseases complicating pregnancy, second trimester: Secondary | ICD-10-CM | POA: Diagnosis not present

## 2023-07-10 DIAGNOSIS — O09292 Supervision of pregnancy with other poor reproductive or obstetric history, second trimester: Secondary | ICD-10-CM | POA: Diagnosis not present

## 2023-07-10 DIAGNOSIS — Z3689 Encounter for other specified antenatal screening: Secondary | ICD-10-CM | POA: Diagnosis not present

## 2023-07-10 DIAGNOSIS — O26899 Other specified pregnancy related conditions, unspecified trimester: Secondary | ICD-10-CM | POA: Diagnosis present

## 2023-07-10 DIAGNOSIS — O4702 False labor before 37 completed weeks of gestation, second trimester: Secondary | ICD-10-CM | POA: Diagnosis not present

## 2023-07-10 DIAGNOSIS — O2312 Infections of bladder in pregnancy, second trimester: Secondary | ICD-10-CM | POA: Diagnosis not present

## 2023-07-10 DIAGNOSIS — O98812 Other maternal infectious and parasitic diseases complicating pregnancy, second trimester: Secondary | ICD-10-CM | POA: Diagnosis not present

## 2023-07-10 LAB — GC/CHLAMYDIA PROBE AMP (~~LOC~~) NOT AT ARMC
Chlamydia: NEGATIVE
Comment: NEGATIVE
Comment: NORMAL
Neisseria Gonorrhea: NEGATIVE

## 2023-07-10 LAB — URINALYSIS, ROUTINE W REFLEX MICROSCOPIC
Bacteria, UA: NONE SEEN
Bilirubin Urine: NEGATIVE
Glucose, UA: NEGATIVE mg/dL
Hgb urine dipstick: NEGATIVE
Ketones, ur: NEGATIVE mg/dL
Nitrite: NEGATIVE
Protein, ur: NEGATIVE mg/dL
Specific Gravity, Urine: 1.017 (ref 1.005–1.030)
WBC, UA: >50 WBC/hpf (ref 0–5)
pH: 6 (ref 5.0–8.0)

## 2023-07-10 LAB — WET PREP, GENITAL
Clue Cells Wet Prep HPF POC: NONE SEEN
Sperm: NONE SEEN
Trich, Wet Prep: NONE SEEN
WBC, Wet Prep HPF POC: >=10 — AB (ref <10)

## 2023-07-10 LAB — HCG, QUANTITATIVE, PREGNANCY: hCG, Beta Chain, Quant, S: 1472 m[IU]/mL — ABNORMAL HIGH (ref <5)

## 2023-07-10 MED ORDER — NITROFURANTOIN MONOHYD MACRO 100 MG PO CAPS
100.0000 mg | ORAL_CAPSULE | Freq: Two times a day (BID) | ORAL | 0 refills | Status: DC
Start: 2023-07-10 — End: 2023-08-21

## 2023-07-10 MED ORDER — POTASSIUM CHLORIDE CRYS ER 20 MEQ PO TBCR
20.0000 meq | EXTENDED_RELEASE_TABLET | Freq: Once | ORAL | Status: AC
  Administered 2023-07-10: 20 meq via ORAL
  Filled 2023-07-10: qty 1

## 2023-07-10 MED ORDER — CYCLOBENZAPRINE HCL 5 MG PO TABS
10.0000 mg | ORAL_TABLET | Freq: Once | ORAL | Status: AC
  Administered 2023-07-10: 10 mg via ORAL
  Filled 2023-07-10: qty 2

## 2023-07-10 MED ORDER — NIFEDIPINE 10 MG PO CAPS: 10.0000 mg | ORAL_CAPSULE | ORAL | 2 refills | Status: AC | PRN

## 2023-07-10 MED ORDER — SODIUM CHLORIDE 0.9 % IV SOLN
2.0000 g | Freq: Once | INTRAVENOUS | Status: AC
  Administered 2023-07-10: 2 g via INTRAVENOUS
  Filled 2023-07-10: qty 20

## 2023-07-10 MED ORDER — NIFEDIPINE 10 MG PO CAPS
10.0000 mg | ORAL_CAPSULE | ORAL | Status: DC | PRN
Start: 2023-07-10 — End: 2023-07-10
  Administered 2023-07-10 (×3): 10 mg via ORAL
  Filled 2023-07-10 (×3): qty 1

## 2023-07-10 MED ORDER — MICONAZOLE NITRATE 2 % VA CREA: 1.0000 | TOPICAL_CREAM | Freq: Every day | VAGINAL | 2 refills | Status: DC

## 2023-07-10 NOTE — MAU Note (Signed)
.  Chelsey Townsend is a 32 y.o. at [redacted]w[redacted]d here in MAU reporting:  LMP:  Transferred from [facility] via [facility].   Pt reports feeling ctxs that are 2-3 min apart. No LOF, No bleeding.+FM.    Pain score: 6/10 lower abdominal cramping, 6/10 lower back cramping that is constant Vitals:   07/10/23 0150 07/10/23 0154  BP:  110/70  Pulse:  72  Resp:    Temp:    SpO2: 99% 99%     FHT:130 Lab orders placed from triage:   Provider at bedside.

## 2023-07-10 NOTE — MAU Provider Note (Signed)
MAU Provider Note  Chief Complaint: Contractions   Provider seen [id number]     SUBJECTIVE HPI: Chelsey Townsend is a 32 y.o. [id number] at [redacted]w[redacted]d by early ultrasound who presents to maternity admissions reporting contractions. Pregnancy c/b hx of 2 prior preterm births, hx C/S. Receives [facility] with [facility].  Patient notes irregular ctx that started earlier today. Became closer together and stronger this evening (Q2 minutes), so presented to [facility] ER for evaluation. CBC, BMP, UA, CBG, HCG obtained. Notable for WBC 17.5, UA with signs of infection, K 3.3.  Upon arrival to MAU, patient notes contractions still ongoing but improved since initial presentation. +FM. Does feel increased urge to urinate. Denies VB, LOF, fever/chills, flank pain, change in vaginal discharge.  HPI  Past Medical History:  Diagnosis Date   Medical history non-contributory    Past Surgical History:  Procedure Laterality Date   CESAREAN SECTION     2021   Social History   Socioeconomic History   Marital status: Single    Spouse name: Not on file   Number of children: Not on file   Years of education: Not on file   Highest education level: Not on file  Occupational History   Not on file  Tobacco Use   Smoking status: Former    Types: Cigarettes   Smokeless tobacco: Never  Vaping Use   Vaping status: Never Used  Substance and Sexual Activity   Alcohol use: Never   Drug use: Never   Sexual activity: Not Currently  Other Topics Concern   Not on file  Social History Narrative   Not on file   Social Determinants of Health   Financial Resource Strain: Not on file  Food Insecurity: Not on file  Transportation Needs: Not on file  Physical Activity: Not on file  Stress: Not on file  Social Connections: Unknown (12/28/2021)   Received from [facility]   Social Network    Social Network: Not on file  Intimate Partner Violence: Not At Risk (02/23/2023)   Received from [facility]   HITS    Over the last 12 months how often did your partner physically hurt you?: Never    Over the last 12 months how often did your partner insult you or talk down to you?: Never    Over the last 12 months how often did your partner threaten you with physical harm?: Never    Over the last 12 months how often did your partner scream or curse at you?: Never   No current facility-administered medications on file prior to encounter.   Current Outpatient Medications on File Prior to Encounter  Medication Sig Dispense Refill   Prenatal Vit-Fe Fumarate-FA (PRENATAL VITAMINS) 28-0.8 MG TABS Take 1 tablet by mouth daily.     ondansetron (ZOFRAN-ODT) 8 MG disintegrating tablet Take 1 tablet (8 mg total) by mouth every 8 (eight) hours as needed for nausea or vomiting. 12 tablet 0   No Known Allergies  ROS:  Pertinent positives/negatives listed above.  I have reviewed patient's Past Medical Hx, Surgical Hx, Family Hx, Social Hx, medications and allergies.   Physical Exam  Patient Vitals for the past 24 hrs:  BP Temp Temp src Pulse Resp SpO2 Height Weight  07/10/23 0243 115/60 -- -- -- -- -- -- --  07/10/23 0217 110/70 -- -- -- -- -- -- --  07/10/23 0215 112/64 -- -- 69 -- 99 % -- --  07/10/23 0200 -- -- -- -- --  98 % -- --  07/10/23 0154 110/70 -- -- 72 -- 99 % -- --  07/10/23 0150 -- -- -- -- -- 99 % -- --  07/10/23 0145 -- -- -- -- -- 100 % -- --  07/10/23 0140 -- -- -- -- -- 100 % -- --  07/10/23 0130 117/72 98.5 F (36.9 C) Oral 70 16 -- 5\' 2"  (1.575 m) 59.9 kg  07/10/23 0100 125/78 -- -- 84 18 100 % -- --  07/10/23 0050 -- -- -- 72 18 100 % -- --  07/10/23 0045 113/71 -- -- 76 17 100 % -- --  07/10/23 0040 -- -- -- 72 15 100 % -- --  07/10/23 0030 112/71 -- -- 73 15 100 % -- --  07/10/23 0020 -- -- -- 75 17 100 % -- --  07/10/23 0015 118/74 98.3 F (36.8 C) Oral 72 15 100 % -- --  07/10/23 0010 -- -- -- 86 16 100 % -- --  07/10/23 0000 122/82 -- -- 73 15 100 % -- --   07/09/23 2345 115/78 98 F (36.7 C) Oral 80 15 100 % -- --  07/09/23 2312 -- -- -- 76 -- 100 % -- --   Constitutional: Well-developed, well-nourished female in no acute distress  Cardiovascular: normal rate Respiratory: normal effort GI: Abd soft, non-tender. No CVA tenderness MS: Extremities nontender, no edema, normal ROM Neurologic: Alert and oriented x 4  GU: Neg CVAT  Cervix: closed/thick/ballotable  FHT:  Baseline 135, moderate variability, accelerations present, no decelerations Contractions: UI, some ctx  LAB RESULTS Results for orders placed or performed during the hospital encounter of 07/09/23 (from the past 24 hour(s))  Basic metabolic panel     Status: Abnormal   Collection Time: 07/09/23 11:17 PM  Result Value Ref Range   Sodium 133 (L) 135 - 145 mmol/L   Potassium 3.3 (L) 3.5 - 5.1 mmol/L   Chloride 103 98 - 111 mmol/L   CO2 21 (L) 22 - 32 mmol/L   Glucose, Bld 94 70 - 99 mg/dL   BUN 11 6 - 20 mg/dL   Creatinine, Ser 3.23 0.44 - 1.00 mg/dL   Calcium 8.8 (L) 8.9 - 10.3 mg/dL   GFR, Estimated >55 >73 mL/min   Anion gap 9 5 - 15  CBC with Differential     Status: Abnormal   Collection Time: 07/09/23 11:17 PM  Result Value Ref Range   WBC 17.5 (H) 4.0 - 10.5 K/uL   RBC 3.80 (L) 3.87 - 5.11 MIL/uL   Hemoglobin 10.8 (L) 12.0 - 15.0 g/dL   HCT 22.0 (L) 25.4 - 27.0 %   MCV 88.2 80.0 - 100.0 fL   MCH 28.4 26.0 - 34.0 pg   MCHC 32.2 30.0 - 36.0 g/dL   RDW 62.3 76.2 - 83.1 %   Platelets 231 150 - 400 K/uL   nRBC 0.1 0.0 - 0.2 %   Neutrophils Relative % 59 %   Neutro Abs 10.4 (H) 1.7 - 7.7 K/uL   Lymphocytes Relative 25 %   Lymphs Abs 4.4 (H) 0.7 - 4.0 K/uL   Monocytes Relative 9 %   Monocytes Absolute 1.5 (H) 0.1 - 1.0 K/uL   Eosinophils Relative 1 %   Eosinophils Absolute 0.2 0.0 - 0.5 K/uL   Basophils Relative 1 %   Basophils Absolute 0.1 0.0 - 0.1 K/uL   Immature Granulocytes 5 %   Abs Immature Granulocytes 0.85 (H) 0.00 - 0.07 K/uL  hCG,  quantitative, pregnancy     Status: Abnormal   Collection Time: 07/09/23 11:17 PM  Result Value Ref Range   hCG, Beta Chain, Quant, S 1,472 (H) <5 mIU/mL  Wet prep, genital     Status: Abnormal   Collection Time: 07/10/23  1:40 AM  Result Value Ref Range   Yeast Wet Prep HPF POC PRESENT (A) NONE SEEN   Trich, Wet Prep NONE SEEN NONE SEEN   Clue Cells Wet Prep HPF POC NONE SEEN NONE SEEN   WBC, Wet Prep HPF POC >=10 (A) <10   Sperm NONE SEEN       IMAGING No results found.  MAU Management/MDM: Orders Placed This Encounter  Procedures   Wet prep, genital   Culture, OB Urine   Basic metabolic panel   CBC with Differential   hCG, quantitative, pregnancy   Urinalysis, Routine w reflex microscopic -Urine, Clean Catch   Saline lock IV   Discharge patient    Meds ordered this encounter  Medications   sodium chloride 0.9 % bolus 1,000 mL   potassium chloride SA (KLOR-CON M) CR tablet 20 mEq   cefTRIAXone (ROCEPHIN) 2 g in sodium chloride 0.9 % 100 mL IVPB    Order Specific Question:   Antibiotic Indication:    Answer:   UTI   NIFEdipine (PROCARDIA) capsule 10 mg   cyclobenzaprine (FLEXERIL) tablet 10 mg   NIFEdipine (PROCARDIA) 10 MG capsule    Sig: Take 1 capsule (10 mg total) by mouth every 4 (four) hours as needed (contractions).    Dispense:  30 capsule    Refill:  2   nitrofurantoin, macrocrystal-monohydrate, (MACROBID) 100 MG capsule    Sig: Take 1 capsule (100 mg total) by mouth 2 (two) times daily.    Dispense:  12 capsule    Refill:  0   miconazole (MONISTAT 7) 2 % vaginal cream    Sig: Place 1 Applicatorful vaginally at bedtime. Apply for seven nights    Dispense:  30 g    Refill:  2     Available prenatal records and [facility] ER records reviewed.  Patient presents with concern for preterm labor with preterm regular contractions. Suspect that UTI is causing these. Will additionally obtain wet prep, GC/C to assess for concurrent vaginitis. Cannot obtain FFN as patient  was checked at [facility] ER.  #UTI: Will treat UTI with CTX 2g x1 followed by Macrobid for complete course. Elevated WBC is concerning for pyelo, however does not rule-in given no fever/chills, CVA tenderness, flank pain.   #Preterm ctx: Will additionally give fluid bolus, procardia for contractions.   #HypoK: Kcl for hypokalemia.  #Yeast infection: Monistat sent.  0402: Reassessed patient at bedside. No longer feeling any ctx, and toco is consistent with this. Cervix closed on my check. Discussed return precautions for pyelo. Has follow-up with [facility].  ASSESSMENT 1. Preterm contractions   2. Acute cystitis without hematuria   3. Yeast infection   4. Hypokalemia   5. NST (non-stress test) reactive   6. [redacted] weeks gestation of pregnancy     PLAN Discharge home with strict return precautions. Allergies as of 07/10/2023   No Known Allergies      Medication List     TAKE these medications    miconazole 2 % vaginal cream Commonly known as: MONISTAT 7 Place 1 Applicatorful vaginally at bedtime. Apply for seven nights   NIFEdipine 10 MG capsule Commonly known as: PROCARDIA Take 1 capsule (10 mg total) by mouth every  4 (four) hours as needed (contractions).   nitrofurantoin (macrocrystal-monohydrate) 100 MG capsule Commonly known as: MACROBID Take 1 capsule (100 mg total) by mouth 2 (two) times daily.   ondansetron 8 MG disintegrating tablet Commonly known as: ZOFRAN-ODT Take 1 tablet (8 mg total) by mouth every 8 (eight) hours as needed for nausea or vomiting.   Prenatal Vitamins 28-0.8 MG Tabs Take 1 tablet by mouth daily.         Wylene Simmer, MD OB Fellow 07/10/2023  4:04 AM

## 2023-07-11 LAB — CULTURE, OB URINE: Culture: <10000 — AB

## 2023-07-20 ENCOUNTER — Encounter (HOSPITAL_COMMUNITY): Payer: Self-pay | Admitting: Obstetrics and Gynecology

## 2023-07-20 ENCOUNTER — Inpatient Hospital Stay (HOSPITAL_COMMUNITY)
Admission: AD | Admit: 2023-07-20 | Discharge: 2023-07-21 | Disposition: A | Discharge status: Home / Self Care | Payer: Medicaid Other | Attending: Obstetrics and Gynecology | Admitting: Obstetrics and Gynecology

## 2023-07-20 DIAGNOSIS — O4703 False labor before 37 completed weeks of gestation, third trimester: Secondary | ICD-10-CM

## 2023-07-20 DIAGNOSIS — O26892 Other specified pregnancy related conditions, second trimester: Secondary | ICD-10-CM | POA: Insufficient documentation

## 2023-07-20 DIAGNOSIS — Z3A27 27 weeks gestation of pregnancy: Secondary | ICD-10-CM | POA: Insufficient documentation

## 2023-07-20 DIAGNOSIS — O4702 False labor before 37 completed weeks of gestation, second trimester: Secondary | ICD-10-CM | POA: Diagnosis not present

## 2023-07-20 LAB — URINALYSIS, ROUTINE W REFLEX MICROSCOPIC
Bacteria, UA: NONE SEEN
Bilirubin Urine: NEGATIVE
Glucose, UA: NEGATIVE mg/dL
Hgb urine dipstick: NEGATIVE
Ketones, ur: NEGATIVE mg/dL
Nitrite: NEGATIVE
Protein, ur: NEGATIVE mg/dL
Specific Gravity, Urine: 1.009 (ref 1.005–1.030)
pH: 6 (ref 5.0–8.0)

## 2023-07-20 LAB — FETAL FIBRONECTIN: Fetal Fibronectin: NEGATIVE

## 2023-07-20 NOTE — MAU Note (Signed)
..  Chelsey Townsend is a 32 y.o. at [redacted]w[redacted]d here in MAU reporting: contractions that are every 5-6 minutes for the past 1-2 hours. Denies vaginal bleeding or leaking of fluid. +FM   Pain score: 8/10 Vitals:   07/20/23 2213  BP: 130/69  Pulse: 73  Resp: 18  Temp: 98.4 F (36.9 C)  SpO2: 100%     FHT:147 Lab orders placed from triage:  UA patient unable to void, she had just used restroom in lobby

## 2023-07-20 NOTE — MAU Provider Note (Signed)
Chief Complaint:  Contractions   Event Date/Time   First Provider Initiated Contact with Patient 07/20/23 2237     HPI: Chelsey Townsend is a 32 y.o. [id number] at 81w5dwho presents to maternity admissions reporting preterm contractions for several hours, every 5 minutes.  Was treated for this a week ago as well as UTI. She reports good fetal movement, denies LOF, vaginal bleeding, urinary symptoms,  or fever/chills.    Abdominal Pain This is a recurrent problem. The current episode started today. The quality of the pain is cramping. Pertinent negatives include no constipation, diarrhea or dysuria. Nothing aggravates the pain. The pain is relieved by Nothing. She has tried nothing for the symptoms.   RN Note: Chelsey Townsend is a 32 y.o. at [redacted]w[redacted]d here in MAU reporting: contractions that are every 5-6 minutes for the past 1-2 hours. Denies vaginal bleeding or leaking of fluid. +FM   Pain score: 8/10  Past Medical History: Past Medical History:  Diagnosis Date   Medical history non-contributory     Past obstetric history: OB History  Gravida Para Term Preterm AB Living  5 3 1 2 1 3   SAB IAB Ectopic Multiple Live Births          3    # Outcome Date GA Lbr Len/2nd Weight Sex Type Anes PTL Lv  5 Current           4 Term 06/16/20 [redacted]w[redacted]d    CS-LTranv   LIV  3 Preterm 12/27/14 [redacted]w[redacted]d    Vag-Spont   LIV  2 Preterm 02/2012 [redacted]w[redacted]d    Vag-Spont   LIV  1 AB             Past Surgical History: Past Surgical History:  Procedure Laterality Date   CESAREAN SECTION     2021    Family History: Family History  Problem Relation Age of Onset   Hypertension Mother    Diabetes Mother    Hypertension Father    Diabetes Father     Social History: Social History   Tobacco Use   Smoking status: Former    Types: Cigarettes   Smokeless tobacco: Never  Vaping Use   Vaping status: Never Used  Substance Use Topics   Alcohol use: Never   Drug use: Never    Allergies: No Known Allergies  Meds:   Medications Prior to Admission  Medication Sig Dispense Refill Last Dose   miconazole (MONISTAT 7) 2 % vaginal cream Place 1 Applicatorful vaginally at bedtime. Apply for seven nights 30 g 2 Past Week   nitrofurantoin, macrocrystal-monohydrate, (MACROBID) 100 MG capsule Take 1 capsule (100 mg total) by mouth 2 (two) times daily. 12 capsule 0 Past Week   Prenatal Vit-Fe Fumarate-FA (PRENATAL VITAMINS) 28-0.8 MG TABS Take 1 tablet by mouth daily.   07/20/2023 at 1000   NIFEdipine (PROCARDIA) 10 MG capsule Take 1 capsule (10 mg total) by mouth every 4 (four) hours as needed (contractions). 30 capsule 2    ondansetron (ZOFRAN-ODT) 8 MG disintegrating tablet Take 1 tablet (8 mg total) by mouth every 8 (eight) hours as needed for nausea or vomiting. 12 tablet 0     I have reviewed patient's Past Medical Hx, Surgical Hx, Family Hx, Social Hx, medications and allergies.   ROS:  Review of Systems  Gastrointestinal:  Positive for abdominal pain. Negative for constipation and diarrhea.  Genitourinary:  Negative for dysuria.   Other systems negative  Physical Exam  Patient Vitals for the past 24 hrs:  BP Temp Temp src Pulse Resp SpO2 Height  07/20/23 2213 130/69 98.4 F (36.9 C) Oral 73 18 100 % 5\' 2"  (1.575 m)   Constitutional: Well-developed, well-nourished female in no acute distress.  Cardiovascular: normal rate  Respiratory: normal effort GI: Abd soft, non-tender, gravid appropriate for gestational age.   No rebound or guarding. MS: Extremities nontender, no edema, normal ROM Neurologic: Alert and oriented x 4.  GU: Neg CVAT.  PELVIC EXAM: Dilation: Closed Effacement (%): Thick Cervical Position: Posterior Station: Ballotable Exam by:: [employer] CNM    FHT:  Baseline 140 , moderate variability, accelerations present, no decelerations Contractions:   Rare   Labs: Results for orders placed or performed during the hospital encounter of 07/20/23 (from the past 24 hour(s))  Fetal  fibronectin     Status: None   Collection Time: 07/20/23 10:46 PM  Result Value Ref Range   Fetal Fibronectin NEGATIVE NEGATIVE  Urinalysis, Routine w reflex microscopic -Urine, Clean Catch     Status: Abnormal   Collection Time: 07/20/23 10:59 PM  Result Value Ref Range   Color, Urine YELLOW YELLOW   APPearance CLEAR CLEAR   Specific Gravity, Urine 1.009 1.005 - 1.030   pH 6.0 5.0 - 8.0   Glucose, UA NEGATIVE NEGATIVE mg/dL   Hgb urine dipstick NEGATIVE NEGATIVE   Bilirubin Urine NEGATIVE NEGATIVE   Ketones, ur NEGATIVE NEGATIVE mg/dL   Protein, ur NEGATIVE NEGATIVE mg/dL   Nitrite NEGATIVE NEGATIVE   Leukocytes,Ua TRACE (A) NEGATIVE   RBC / HPF 0-5 0 - 5 RBC/hpf   WBC, UA 0-5 0 - 5 WBC/hpf   Bacteria, UA NONE SEEN NONE SEEN   Squamous Epithelial / HPF 0-5 0 - 5 /HPF   Mucus PRESENT        Imaging:  No results found.  MAU Course/MDM: I have reviewed the triage vital signs and the nursing notes.   Pertinent labs & imaging results that were available during my care of the patient were reviewed by me and considered in my medical decision making (see chart for details).      I have reviewed her medical records including past results, notes and treatments.   I have ordered labs and reviewed results. UA is clear and fetal fibronectin was negative. NST reviewed  Treatments in MAU included EFM and Fetal Fibronectin.  Discussed the contractions she had earlier seem to have abated.  Will not treat rare contractions at this time  Preterm labor precautions..    Assessment: Single IUP at [redacted]w[redacted]d Threatened preterm labor  Plan: Discharge home Preterm Labor precautions and fetal kick counts Follow up in Office for prenatal visits and recheck Encouraged to return if she develops worsening of symptoms, increase in pain, fever, or other concerning symptoms.   Pt stable at time of discharge.  Wynelle Bourgeois CNM, MSN Certified Nurse-Midwife 07/20/2023 10:37 PM

## 2023-07-21 DIAGNOSIS — O4702 False labor before 37 completed weeks of gestation, second trimester: Secondary | ICD-10-CM

## 2023-07-21 DIAGNOSIS — Z3A27 27 weeks gestation of pregnancy: Secondary | ICD-10-CM

## 2023-08-17 ENCOUNTER — Inpatient Hospital Stay (HOSPITAL_COMMUNITY)
Admission: AD | Admit: 2023-08-17 | Discharge: 2023-08-18 | Disposition: A | Discharge status: Home / Self Care | Payer: Medicaid Other | Attending: Obstetrics and Gynecology | Admitting: Obstetrics and Gynecology

## 2023-08-17 ENCOUNTER — Encounter (HOSPITAL_COMMUNITY): Payer: Self-pay | Admitting: Obstetrics and Gynecology

## 2023-08-17 DIAGNOSIS — O4703 False labor before 37 completed weeks of gestation, third trimester: Secondary | ICD-10-CM | POA: Diagnosis present

## 2023-08-17 DIAGNOSIS — O09213 Supervision of pregnancy with history of pre-term labor, third trimester: Secondary | ICD-10-CM | POA: Insufficient documentation

## 2023-08-17 DIAGNOSIS — Z3A31 31 weeks gestation of pregnancy: Secondary | ICD-10-CM | POA: Diagnosis not present

## 2023-08-17 LAB — URINALYSIS, ROUTINE W REFLEX MICROSCOPIC
Bilirubin Urine: NEGATIVE
Glucose, UA: NEGATIVE mg/dL
Hgb urine dipstick: NEGATIVE
Ketones, ur: NEGATIVE mg/dL
Nitrite: NEGATIVE
Protein, ur: 30 mg/dL — AB
Specific Gravity, Urine: 1.02 (ref 1.005–1.030)
WBC, UA: >50 WBC/hpf (ref 0–5)
pH: 5 (ref 5.0–8.0)

## 2023-08-17 MED ORDER — NIFEDIPINE 10 MG PO CAPS
10.0000 mg | ORAL_CAPSULE | ORAL | Status: AC | PRN
Start: 2023-08-17 — End: 2023-08-17
  Administered 2023-08-17 (×4): 10 mg via ORAL
  Filled 2023-08-17 (×4): qty 1

## 2023-08-17 NOTE — MAU Note (Signed)
Kwanita Orman is a 32 y.o. at [redacted]w[redacted]d here in MAU reporting: CTX all day that are irregular. Pt states they were closer together earlier but now they are every 15 minutes. Pt states she has had an increase in thin/thick white discharge. Pt states she is unsure if there is a bad odor with it but she hasn't smelt anything. Pt reports some spotting when she wiped after going to the bathroom today. +FM. Pt denies LOF.   Onset of complaint: 08/17/2023 all day  Pain score: 9/10 lower abdominal  9/10 lower back  Vitals:   08/17/23 2151 08/17/23 2152  BP: 109/65 109/65  Pulse: 98 93  Resp: 18   Temp: 98.2 F (36.8 C)   SpO2: 100% 100%     FHT:130 Lab orders placed from triage:  7

## 2023-08-18 ENCOUNTER — Inpatient Hospital Stay (EMERGENCY_DEPARTMENT_HOSPITAL)
Admission: AD | Admit: 2023-08-18 | Discharge: 2023-08-18 | Disposition: A | Discharge status: Home / Self Care | Payer: Medicaid Other | Source: Home / Self Care | Attending: Family Medicine | Admitting: Family Medicine

## 2023-08-18 DIAGNOSIS — O4703 False labor before 37 completed weeks of gestation, third trimester: Secondary | ICD-10-CM

## 2023-08-18 DIAGNOSIS — Z3A31 31 weeks gestation of pregnancy: Secondary | ICD-10-CM | POA: Diagnosis not present

## 2023-08-18 MED ORDER — TERBUTALINE SULFATE 1 MG/ML IJ SOLN
0.2500 mg | Freq: Once | INTRAMUSCULAR | Status: AC
  Administered 2023-08-18: 0.25 mg via SUBCUTANEOUS
  Filled 2023-08-18: qty 1

## 2023-08-18 MED ORDER — BETAMETHASONE SOD PHOS & ACET 6 (3-3) MG/ML IJ SUSP
12.0000 mg | Freq: Once | INTRAMUSCULAR | Status: AC
  Administered 2023-08-18: 12 mg via INTRAMUSCULAR
  Filled 2023-08-18: qty 5

## 2023-08-18 NOTE — MAU Provider Note (Signed)
 Chief Complaint: Injections   Event Date/Time   First Provider Initiated Contact with Patient 08/18/23 2157      SUBJECTIVE HPI: Chelsey Townsend is a 32 y.o. [id number] who presents to maternity admissions for second dose of betamethasone .  She was seen on 08/17/23 with contractions and given Procardia  and Terbutaline . Cervix was unchanged at 1cm/long on exam.     Past Medical History:  Diagnosis Date   Medical history non-contributory    Past Surgical History:  Procedure Laterality Date   CESAREAN SECTION     2021   Social History   Socioeconomic History   Marital status: Single    Spouse name: Not on file   Number of children: Not on file   Years of education: Not on file   Highest education level: Not on file  Occupational History   Not on file  Tobacco Use   Smoking status: Former    Types: Cigarettes   Smokeless tobacco: Never  Vaping Use   Vaping status: Never Used  Substance and Sexual Activity   Alcohol use: Never   Drug use: Never   Sexual activity: Not Currently  Other Topics Concern   Not on file  Social History Narrative   Not on file   Social Drivers of Health   Financial Resource Strain: Not on file  Food Insecurity: Not on file  Transportation Needs: Not on file  Physical Activity: Not on file  Stress: Not on file  Social Connections: Unknown (12/28/2021)   Received from [facility]   Social Network    Social Network: Not on file  Intimate Partner Violence: Not At Risk (02/23/2023)   Received from [facility]   HITS    Over the last 12 months how often did your partner physically hurt you?: Never    Over the last 12 months how often did your partner insult you or talk down to you?: Never    Over the last 12 months how often did your partner threaten you with physical harm?: Never    Over the last 12 months how often did your partner scream or curse at you?: Never   No current facility-administered medications on file prior to  encounter.   Current Outpatient Medications on File Prior to Encounter  Medication Sig Dispense Refill   miconazole  (MONISTAT  7) 2 % vaginal cream Place 1 Applicatorful vaginally at bedtime. Apply for seven nights (Patient not taking: Reported on 08/18/2023) 30 g 2   NIFEdipine  (PROCARDIA ) 10 MG capsule Take 1 capsule (10 mg total) by mouth every 4 (four) hours as needed (contractions). 30 capsule 2   nitrofurantoin , macrocrystal-monohydrate, (MACROBID ) 100 MG capsule Take 1 capsule (100 mg total) by mouth 2 (two) times daily. 12 capsule 0   ondansetron  (ZOFRAN -ODT) 8 MG disintegrating tablet Take 1 tablet (8 mg total) by mouth every 8 (eight) hours as needed for nausea or vomiting. (Patient not taking: Reported on 08/18/2023) 12 tablet 0   Prenatal Vit-Fe Fumarate-FA (PRENATAL VITAMINS) 28-0.8 MG TABS Take 1 tablet by mouth daily.     No Known Allergies  ROS:  Review of Systems  Constitutional:  Negative for chills, fatigue and fever.  Eyes:  Negative for visual disturbance.  Respiratory:  Negative for shortness of breath.   Cardiovascular:  Negative for chest pain.  Gastrointestinal:  Negative for abdominal pain, nausea and vomiting.  Genitourinary:  Negative for difficulty urinating, dysuria, flank pain, pelvic pain, vaginal bleeding, vaginal discharge and vaginal pain.  Neurological:  Negative for dizziness and headaches.  Psychiatric/Behavioral: Negative.      I have reviewed patient's Past Medical Hx, Surgical Hx, Family Hx, Social Hx, medications and allergies.   Physical Exam  Patient Vitals for the past 24 hrs:  BP Temp Pulse Resp SpO2 Height Weight  08/18/23 2139 118/70 98.1 F (36.7 C) 94 17 99 % 5' 2 (1.575 m) 69.9 kg   Physical Exam Vitals and nursing note reviewed.  Constitutional:      Appearance: She is well-developed.  Cardiovascular:     Rate and Rhythm: Normal rate.  Pulmonary:     Effort: Pulmonary effort is normal.  Abdominal:     Palpations: Abdomen is  soft.  Musculoskeletal:        General: Normal range of motion.     Cervical back: Normal range of motion.  Skin:    General: Skin is warm and dry.  Neurological:     Mental Status: She is alert and oriented to person, place, and time.  Psychiatric:        Behavior: Behavior normal.        Thought Content: Thought content normal.        Judgment: Judgment normal.     MDM:  Medical screen performed. No emergent findings. Second dose of BMZ given by RN.  Pt denies cramping/contractions like she was having yesterday.  D/C home with PTL precautions.   ASSESSMENT MSE Complete  PLAN Discharge home   Chelsey Townsend 08/18/2023 9:58 PM

## 2023-08-18 NOTE — Progress Notes (Signed)
Sharen Counter CNM in to see pt after BMZ inj and pt then d/c home by provider

## 2023-08-18 NOTE — MAU Note (Signed)
.  Chelsey Townsend is a 32 y.o. at [redacted]w[redacted]d here in MAU reporting coming in for 2nd BMZ injection. Reports good FM. Denies LOF or VB and no pain.    Onset of complaint: n/a Pain score: 0 Vitals:   08/18/23 2139  BP: 118/70  Pulse: 94  Resp: 17  Temp: 98.1 F (36.7 C)  SpO2: 99%     FHT:132 Lab orders placed from triage: BMZ inj

## 2023-08-21 ENCOUNTER — Inpatient Hospital Stay (HOSPITAL_COMMUNITY)
Admission: AD | Admit: 2023-08-21 | Discharge: 2023-08-21 | Disposition: A | Discharge status: Home / Self Care | Payer: Medicaid Other | Attending: Obstetrics & Gynecology | Admitting: Obstetrics & Gynecology

## 2023-08-21 ENCOUNTER — Encounter (HOSPITAL_COMMUNITY): Payer: Self-pay | Admitting: Obstetrics & Gynecology

## 2023-08-21 DIAGNOSIS — O23593 Infection of other part of genital tract in pregnancy, third trimester: Secondary | ICD-10-CM | POA: Diagnosis not present

## 2023-08-21 DIAGNOSIS — Z3A32 32 weeks gestation of pregnancy: Secondary | ICD-10-CM | POA: Insufficient documentation

## 2023-08-21 DIAGNOSIS — O98813 Other maternal infectious and parasitic diseases complicating pregnancy, third trimester: Secondary | ICD-10-CM | POA: Diagnosis not present

## 2023-08-21 DIAGNOSIS — O4703 False labor before 37 completed weeks of gestation, third trimester: Secondary | ICD-10-CM | POA: Diagnosis present

## 2023-08-21 DIAGNOSIS — O479 False labor, unspecified: Secondary | ICD-10-CM | POA: Diagnosis not present

## 2023-08-21 DIAGNOSIS — B3731 Acute candidiasis of vulva and vagina: Secondary | ICD-10-CM | POA: Insufficient documentation

## 2023-08-21 DIAGNOSIS — Z0371 Encounter for suspected problem with amniotic cavity and membrane ruled out: Secondary | ICD-10-CM | POA: Diagnosis not present

## 2023-08-21 LAB — URINALYSIS, ROUTINE W REFLEX MICROSCOPIC
Bilirubin Urine: NEGATIVE
Glucose, UA: NEGATIVE mg/dL
Hgb urine dipstick: NEGATIVE
Ketones, ur: NEGATIVE mg/dL
Nitrite: NEGATIVE
Protein, ur: NEGATIVE mg/dL
Specific Gravity, Urine: 1.014 (ref 1.005–1.030)
pH: 6 (ref 5.0–8.0)

## 2023-08-21 LAB — WET PREP, GENITAL
Clue Cells Wet Prep HPF POC: NONE SEEN
Sperm: NONE SEEN
Trich, Wet Prep: NONE SEEN
WBC, Wet Prep HPF POC: >=10 — AB (ref <10)
Yeast Wet Prep HPF POC: NONE SEEN

## 2023-08-21 LAB — POCT FERN TEST: POCT Fern Test: NEGATIVE

## 2023-08-21 MED ORDER — TERCONAZOLE 0.8 % VA CREA: 1.0000 | TOPICAL_CREAM | Freq: Every day | VAGINAL | 0 refills | Status: AC

## 2023-08-21 NOTE — MAU Note (Signed)
 Pt says she had vag trickling at 11am  And then felt again at 630pm- clear fluid .  [facility] - in [employer] [facility] - some all day. Did not call her Dr's office - doesn't know Dr's name . Denies HSV Last  sex- when she got preg.

## 2023-08-21 NOTE — MAU Provider Note (Signed)
 History     CSN: [id number]  Arrival date and time: 08/21/23 1907   Event Date/Time   First Provider Initiated Contact with Patient 08/21/23 1948      Chief Complaint  Patient presents with   Contractions   Rupture of Membranes   HPI Chelsey Townsend is a 33 y.o. H4E8786 at [redacted]w[redacted]d who presents for contractions & vaginal discharge. Reports contractions for the last few weeks. Has been seen previously in MAU for contractions. Has had 2 episodes watery discharge today & unsure if her water broke. Denies dysuria, vaginal bleeding, vaginal itching/irritation, or recent intercourse. Reports good fetal movement. Has appt with her OB on Tuesday.   OB History     Gravida  5   Para  3   Term  1   Preterm  2   AB  1   Living  3      SAB      IAB      Ectopic      Multiple      Live Births  3           Past Medical History:  Diagnosis Date   Medical history non-contributory     Past Surgical History:  Procedure Laterality Date   CESAREAN SECTION     2021    Family History  Problem Relation Age of Onset   Hypertension Mother    Diabetes Mother    Hypertension Father    Diabetes Father     Social History   Tobacco Use   Smoking status: Former    Types: Cigarettes   Smokeless tobacco: Never  Vaping Use   Vaping status: Never Used  Substance Use Topics   Alcohol use: Never   Drug use: Never    Allergies: No Known Allergies  Medications Prior to Admission  Medication Sig Dispense Refill Last Dose/Taking   NIFEdipine  (PROCARDIA ) 10 MG capsule Take 1 capsule (10 mg total) by mouth every 4 (four) hours as needed (contractions). 30 capsule 2 08/21/2023 at  5:00 PM   Prenatal Vit-Fe Fumarate-FA (PRENATAL VITAMINS) 28-0.8 MG TABS Take 1 tablet by mouth daily.   08/21/2023 at  6:00 AM   miconazole  (MONISTAT  7) 2 % vaginal cream Place 1 Applicatorful vaginally at bedtime. Apply for seven nights (Patient not taking: Reported on 08/18/2023) 30 g 2     nitrofurantoin , macrocrystal-monohydrate, (MACROBID ) 100 MG capsule Take 1 capsule (100 mg total) by mouth 2 (two) times daily. 12 capsule 0    ondansetron  (ZOFRAN -ODT) 8 MG disintegrating tablet Take 1 tablet (8 mg total) by mouth every 8 (eight) hours as needed for nausea or vomiting. (Patient not taking: Reported on 08/18/2023) 12 tablet 0     Review of Systems  All other systems reviewed and are negative.  Physical Exam   Blood pressure 113/71, pulse 90, temperature 97.9 F (36.6 C), temperature source Oral, resp. rate 16, height 5' 2 (1.575 m), weight 69.3 kg, unknown if currently breastfeeding.  Physical Exam Vitals and nursing note reviewed. Exam conducted with a chaperone present.  Constitutional:      General: She is not in acute distress.    Appearance: She is well-developed. She is not ill-appearing or diaphoretic.  HENT:     Head: Normocephalic and atraumatic.  Eyes:     General: No scleral icterus.       Right eye: No discharge.        Left eye: No discharge.     Conjunctiva/sclera: Conjunctivae normal.  Pulmonary:     Effort: Pulmonary effort is normal. No respiratory distress.  Abdominal:     Palpations: Abdomen is soft.     Tenderness: There is no abdominal tenderness.  Genitourinary:    Comments: Spec: NEFG. No pooling or blood. Moderate amount of green tinged clumpy discharge.  Dilation: 1 Effacement (%): 0 Cervical Position: Posterior Station: -3 Exam by:: Jerilynn, NP  Neurological:     General: No focal deficit present.     Mental Status: She is alert.  Psychiatric:        Mood and Affect: Mood normal.        Behavior: Behavior normal.        Thought Content: Thought content normal.    NST:  Baseline: 140 bpm, Variability: Good {> 6 bpm), Accelerations: Reactive, and Decelerations: Absent  MAU Course  Procedures Results for orders placed or performed during the hospital encounter of 08/21/23 (from the past 24 hours)  Urinalysis, Routine w  reflex microscopic -Urine, Clean Catch     Status: Abnormal   Collection Time: 08/21/23  7:39 PM  Result Value Ref Range   Color, Urine YELLOW YELLOW   APPearance HAZY (A) CLEAR   Specific Gravity, Urine 1.014 1.005 - 1.030   pH 6.0 5.0 - 8.0   Glucose, UA NEGATIVE NEGATIVE mg/dL   Hgb urine dipstick NEGATIVE NEGATIVE   Bilirubin Urine NEGATIVE NEGATIVE   Ketones, ur NEGATIVE NEGATIVE mg/dL   Protein, ur NEGATIVE NEGATIVE mg/dL   Nitrite NEGATIVE NEGATIVE   Leukocytes,Ua LARGE (A) NEGATIVE   RBC / HPF 0-5 0 - 5 RBC/hpf   WBC, UA 21-50 0 - 5 WBC/hpf   Bacteria, UA RARE (A) NONE SEEN   Squamous Epithelial / HPF 0-5 0 - 5 /HPF   Mucus PRESENT   Wet prep, genital     Status: Abnormal   Collection Time: 08/21/23  8:01 PM  Result Value Ref Range   Yeast Wet Prep HPF POC NONE SEEN NONE SEEN   Trich, Wet Prep NONE SEEN NONE SEEN   Clue Cells Wet Prep HPF POC NONE SEEN NONE SEEN   WBC, Wet Prep HPF POC >=10 (A) <10   Sperm NONE SEEN   POCT fern test     Status: Normal   Collection Time: 08/21/23  8:11 PM  Result Value Ref Range   POCT Fern Test Negative = intact amniotic membranes     MDM SSE performed. Fern slide, wet prep, & u/a collected.   Assessment and Plan   1. Braxton Hicks contractions  -Contracting on the monitor every 4-5 minutes. Cervix unchanged from previous MAU visits. Offered tocolytics. Patient declines. Prefers to go home, has close f/u.  -Reviewed PTL precautions & reasons to return  2. Encounter for suspected PROM, with rupture of membranes not found  -SSE performed. No pooling & fern negative  3. Vaginal yeast infection  -Wet prep negative. Exam consistent with yeast. Will treat per clinical judgment.  -Rx terazol  4. [redacted] weeks gestation of pregnancy      Rocky Jerilynn 08/21/2023, 7:48 PM

## 2023-08-23 LAB — CULTURE, OB URINE: Special Requests: NORMAL

## 2023-08-25 ENCOUNTER — Inpatient Hospital Stay (HOSPITAL_COMMUNITY)
Admission: AD | Admit: 2023-08-25 | Discharge: 2023-08-25 | Disposition: A | Discharge status: Home / Self Care | Payer: Medicaid Other | Attending: Family Medicine | Admitting: Family Medicine

## 2023-08-25 ENCOUNTER — Encounter (HOSPITAL_COMMUNITY): Payer: Self-pay | Admitting: Obstetrics & Gynecology

## 2023-08-25 DIAGNOSIS — K625 Hemorrhage of anus and rectum: Secondary | ICD-10-CM | POA: Diagnosis not present

## 2023-08-25 DIAGNOSIS — Z3689 Encounter for other specified antenatal screening: Secondary | ICD-10-CM | POA: Diagnosis not present

## 2023-08-25 DIAGNOSIS — O4693 Antepartum hemorrhage, unspecified, third trimester: Secondary | ICD-10-CM | POA: Diagnosis present

## 2023-08-25 DIAGNOSIS — O2243 Hemorrhoids in pregnancy, third trimester: Secondary | ICD-10-CM | POA: Diagnosis not present

## 2023-08-25 DIAGNOSIS — D62 Acute posthemorrhagic anemia: Secondary | ICD-10-CM | POA: Insufficient documentation

## 2023-08-25 DIAGNOSIS — Z3A33 33 weeks gestation of pregnancy: Secondary | ICD-10-CM | POA: Diagnosis not present

## 2023-08-25 DIAGNOSIS — O99013 Anemia complicating pregnancy, third trimester: Secondary | ICD-10-CM | POA: Diagnosis not present

## 2023-08-25 DIAGNOSIS — R5383 Other fatigue: Secondary | ICD-10-CM | POA: Diagnosis present

## 2023-08-25 DIAGNOSIS — K649 Unspecified hemorrhoids: Secondary | ICD-10-CM

## 2023-08-25 HISTORY — DX: Anxiety disorder, unspecified: F41.9

## 2023-08-25 HISTORY — DX: Depression, unspecified: F32.A

## 2023-08-25 LAB — CBC
HCT: 32.9 % — ABNORMAL LOW (ref 36.0–46.0)
Hemoglobin: 9.9 g/dL — ABNORMAL LOW (ref 12.0–15.0)
MCH: 25.1 pg — ABNORMAL LOW (ref 26.0–34.0)
MCHC: 30.1 g/dL (ref 30.0–36.0)
MCV: 83.5 fL (ref 80.0–100.0)
Platelets: 265 10*3/uL (ref 150–400)
RBC: 3.94 MIL/uL (ref 3.87–5.11)
RDW: 15.2 % (ref 11.5–15.5)
WBC: 17.7 10*3/uL — ABNORMAL HIGH (ref 4.0–10.5)
nRBC: 0.3 % — ABNORMAL HIGH (ref 0.0–0.2)

## 2023-08-25 LAB — WET PREP, GENITAL
Clue Cells Wet Prep HPF POC: NONE SEEN
Sperm: NONE SEEN
Trich, Wet Prep: NONE SEEN
WBC, Wet Prep HPF POC: >=10 — AB (ref <10)
Yeast Wet Prep HPF POC: NONE SEEN

## 2023-08-25 MED ORDER — FERRIC MALTOL 30 MG PO CAPS: 1.0000 | ORAL_CAPSULE | ORAL | 1 refills | Status: AC

## 2023-08-25 MED ORDER — LACTATED RINGERS IV SOLN: INTRAVENOUS | Status: DC

## 2023-08-25 MED ORDER — TERBUTALINE SULFATE 1 MG/ML IJ SOLN
INTRAMUSCULAR | Status: AC
  Filled 2023-08-25: qty 1

## 2023-08-25 MED ORDER — HYDROCORTISONE ACETATE 25 MG RE SUPP: 25.0000 mg | Freq: Two times a day (BID) | RECTAL | 1 refills | Status: AC

## 2023-08-25 NOTE — MAU Provider Note (Signed)
 History     CSN: [id number] Arrival date and time: 08/25/23 1615   Event Date/Time   First Provider Initiated Contact with Patient 08/25/23 1657      Chief Complaint  Patient presents with   Vaginal Bleeding   Vaginal Bleeding Pertinent negatives include no abdominal pain, back pain, chills, diarrhea, dysuria, fever, flank pain, nausea, rash, sore throat or vomiting.   Patient is 33 y.o. [id number] [redacted]w[redacted]d here with complaints of vaginal bleeding. Reports she went to the bathroom and wiped and had bright red bleeding. This is the first episode of bleeding. She reports she continued to have bleeding. It started 20 PTA. She reports no placental abnormalities. She is patient at [facility] and was seen yesterday. She reports 2 PTB and 1 term delivery by CS. She reports mild cramping in her lower abdomen and back.   This is patient's 6th visit to the MAU, plans on delivering at [facility]  +FM, denies LOF, VB, contractions, vaginal discharge.  OB History     Gravida  5   Para  3   Term  1   Preterm  2   AB  1   Living  3      SAB      IAB      Ectopic      Multiple      Live Births  3           Past Medical History:  Diagnosis Date   Anxiety    Depression    Medical history non-contributory     Past Surgical History:  Procedure Laterality Date   CESAREAN SECTION     2021    Family History  Problem Relation Age of Onset   Hypertension Mother    Diabetes Mother    Hypertension Father    Diabetes Father     Social History   Tobacco Use   Smoking status: Former    Types: Cigarettes   Smokeless tobacco: Never  Vaping Use   Vaping status: Never Used  Substance Use Topics   Alcohol use: Never   Drug use: Never    Allergies: No Known Allergies  Medications Prior to Admission  Medication Sig Dispense Refill Last Dose/Taking   NIFEdipine  (PROCARDIA ) 10 MG capsule Take 1 capsule (10 mg total) by mouth every 4 (four) hours as  needed (contractions). 30 capsule 2 08/24/2023   Prenatal Vit-Fe Fumarate-FA (PRENATAL VITAMINS) 28-0.8 MG TABS Take 1 tablet by mouth daily.   08/25/2023   terconazole  (TERAZOL 3 ) 0.8 % vaginal cream Place 1 applicator vaginally at bedtime. Apply nightly for three nights. 20 g 0 08/24/2023   ondansetron  (ZOFRAN -ODT) 8 MG disintegrating tablet Take 1 tablet (8 mg total) by mouth every 8 (eight) hours as needed for nausea or vomiting. (Patient not taking: Reported on 08/18/2023) 12 tablet 0     Review of Systems  Constitutional:  Negative for chills and fever.  HENT:  Negative for congestion and sore throat.   Eyes:  Negative for pain and visual disturbance.  Respiratory:  Negative for cough, chest tightness and shortness of breath.   Cardiovascular:  Negative for chest pain.  Gastrointestinal:  Negative for abdominal pain, diarrhea, nausea and vomiting.  Endocrine: Negative for cold intolerance and heat intolerance.  Genitourinary:  Positive for vaginal bleeding. Negative for dysuria and flank pain.  Musculoskeletal:  Negative for back pain.  Skin:  Negative for rash.  Allergic/Immunologic: Negative for food allergies.  Neurological:  Negative for dizziness and light-headedness.  Psychiatric/Behavioral:  Negative for agitation.    Physical Exam   Blood pressure 109/66, pulse 91, temperature 98 F (36.7 C), temperature source Oral, resp. rate 20, SpO2 100%, unknown if currently breastfeeding.  Physical Exam Vitals and nursing note reviewed. Exam conducted with a chaperone present.  Constitutional:      General: She is not in acute distress.    Appearance: She is well-developed.     Comments: Pregnant female  HENT:     Head: Normocephalic and atraumatic.  Eyes:     General: No scleral icterus.    Conjunctiva/sclera: Conjunctivae normal.  Cardiovascular:     Rate and Rhythm: Normal rate.  Pulmonary:     Effort: Pulmonary effort is normal.  Chest:     Chest wall: No tenderness.   Abdominal:     Palpations: Abdomen is soft.     Tenderness: There is no abdominal tenderness. There is no guarding or rebound.     Comments: Gravid  Genitourinary:    Exam position: Lithotomy position.     Labia:        Right: No tenderness.        Left: No tenderness.      Vagina: Vaginal discharge (Thick green white discharge) present. No bleeding.     Cervix: Normal. No discharge, friability or cervical bleeding.     Uterus: Enlarged (gravid 33cm).      Rectum: External hemorrhoid and internal hemorrhoid present.     Comments: Absolutely does not have vaginal bleeding, all bleeding is clearly rectal in origin. I removed a 2cm clot from the anus that was partially expelled. She has notable circumferential external hemorrhoids, suspect bleeding was from internal hemorrhoid.  Musculoskeletal:        General: Normal range of motion.     Cervical back: Normal range of motion and neck supple.  Skin:    General: Skin is warm and dry.     Findings: No rash.  Neurological:     Mental Status: She is alert and oriented to person, place, and time.   Dilation: 1 Effacement (%): 0 Cervical Position: Posterior Presentation: Undeterminable Exam by:: Dr. Eldonna   MAU Course  Procedures I reviewed the patient's fetal monitoring.  Baseline HR: 140 Variability:  moderate Accels:present Decels: none  A/P: Reactive NST  Reassured regarding fetal status.    MDM- HIGH  Patient presented with bright red vaginal bleeding is a chief complaint had soaked a pad in triage.  Fatigue mobilized quickly with several nurses and myself seen the patient urgently in her MAU room.  An IV was placed labs were drawn.  Monitors were placed on the maternal belly which showed a normal fetal heart rate in the 140s.   I then performed a speculum exam within the first 10 minutes of being in the room with the patient.  It was notable for distinct rectal bleeding and not vaginal bleeding.  However the differential  was complex in this case because the patient was presenting what was thought to be vaginal bleeding.  The differential for this includes abruption previa and preterm birth.  Patient is at risk for preterm birth with her history of 2 prior preterm birth.  Labs drawn - CBC, ABORH (collected rainbow due to urgent presentation)  Orders Placed This Encounter  Procedures   Wet prep, genital    Standing Status:   Standing    Number of Occurrences:   1   CBC  Standing Status:   Standing    Number of Occurrences:   1   ABO/Rh    Standing Status:   Standing    Number of Occurrences:   1   Insert peripheral IV    Standing Status:   Standing    Number of Occurrences:   1   Discharge patient    Discharge disposition:   01-Home or Self Care [1]    Discharge patient date:   08/25/2023     Assessment and Plan   1. Bright red rectal bleeding   2. Bleeding hemorrhoid   3. [redacted] weeks gestation of pregnancy   4. NST (non-stress test) reactive   5. Anemia affecting pregnancy in third trimester     - Rx for hemorrhoid cream sent - Recommend GI referral after pregnancy - Return for continued copious BRBPR - Anemia on CBC- rx for ferric maltol  sent to pharmacy  Chelsey Townsend 08/25/2023, 7:02 PM

## 2023-08-25 NOTE — MAU Note (Signed)
 RN pulled patient from the lobby for triage and patient informed RN that she began experiencing heavy bright red vaginal bleeding around 20 minutes ago. Patient sat down in triage chair and bright red blood noted on chucks pad in chair. FHT obtained at 146. RN informed patient that due to amount of bleeding RN would like to swiftly get the patient into a room with an MD.   Luke Masters, MD, notified and requested to come to bedside STAT due to patient complaint. MD at bedside immediately. Patient placed onto fetal monitors and tocometer. IV access initiated with 18 gauge to the left forearm. Labs drawn.

## 2023-08-26 LAB — GC/CHLAMYDIA PROBE AMP (~~LOC~~) NOT AT ARMC
Chlamydia: NEGATIVE
Comment: NEGATIVE
Comment: NORMAL
Neisseria Gonorrhea: NEGATIVE

## 2023-09-02 ENCOUNTER — Telehealth: Payer: Self-pay | Admitting: Lactation Services

## 2023-09-02 NOTE — Telephone Encounter (Signed)
 PA submitted through Covermymeds. Awaiting determination  Chelsey Townsend (Key: [id number]) Rx #: [id number] Need Help? Call us  at [phone] Status sent iconSent to Plan today Drug Anucort-HC  25MG  suppositories ePA cloud logo Form [employer] Electronic Prior Authorization Form Original Claim Info 70,MR

## 2023-09-04 NOTE — Telephone Encounter (Signed)
Allena Earing (Key: [id number]) PA Case ID #: [id number] Rx #: [id number] Need Help? Call us at [phone] Outcome Denied on January 15 by [employer] 2017 Denied. [facility] does not cover the following service(s) in the [facility] Plan: [id number] [facility] 25MG  Suppository This drug is considered to be less than effective by the Food and Drug Administration's (FDA) Drug Efficacy Study Implementation (DESI). Drugs considered to be less than effective by the FDA (DESI Drugs) are not covered under your pharmacy benefit. Drug Anucort-HC 25MG  suppositories ePA cloud logo Form [employer] Electronic Prior Authorization Form Original Claim Info 70,MR  Message sent to Dr. Alvester Morin for advisement.
